# Patient Record
Sex: Female | Born: 1980 | Race: Black or African American | Hispanic: No | Marital: Married | Smoking: Never smoker
Health system: Southern US, Community
[De-identification: ages and names within clinical notes are randomized; demographics above are authoritative.]

---

## 2002-01-13 ENCOUNTER — Other Ambulatory Visit: Admission: RE | Admit: 2002-01-13 | Discharge: 2002-01-13 | Payer: Self-pay

## 2014-05-04 ENCOUNTER — Encounter: Payer: Self-pay | Admitting: Family Medicine

## 2014-05-04 ENCOUNTER — Ambulatory Visit (INDEPENDENT_AMBULATORY_CARE_PROVIDER_SITE_OTHER): Payer: BC Managed Care – PPO | Admitting: Family Medicine

## 2014-05-04 ENCOUNTER — Encounter (INDEPENDENT_AMBULATORY_CARE_PROVIDER_SITE_OTHER): Payer: Self-pay

## 2014-05-04 VITALS — BP 107/69 | HR 58 | Temp 98.4°F | Ht 64.5 in | Wt 157.0 lb

## 2014-05-04 DIAGNOSIS — R21 Rash and other nonspecific skin eruption: Secondary | ICD-10-CM

## 2014-05-04 MED ORDER — NYSTATIN-TRIAMCINOLONE 100000-0.1 UNIT/GM-% EX OINT: 1.0000 "application " | TOPICAL_OINTMENT | Freq: Two times a day (BID) | CUTANEOUS | Status: DC

## 2014-05-04 NOTE — Progress Notes (Signed)
   Subjective:    Patient ID: Natalie Ortiz, female    DOB: 1981-10-02, 33 y.o.   MRN: [id number]  HPI This 33 y.o. female presents for evaluation of rash on feet.  She gets this rash on her feet Every year around this time.   Review of Systems C/o rash on feet No chest pain, SOB, HA, dizziness, vision change, N/V, diarrhea, constipation, dysuria, urinary urgency or frequency, myalgias, arthralgias.     Objective:   Physical Exam Vital signs noted  Well developed well nourished female.   Extremities - No edema. Skin - macular papular rash on top of feet       Assessment & Plan:  Rash and nonspecific skin eruption - Plan: nystatin-triamcinolone ointment (MYCOLOG) Apply bid and follow up prn if not better.  Deatra CanterWilliam J Oxford FNP

## 2015-11-02 ENCOUNTER — Ambulatory Visit (INDEPENDENT_AMBULATORY_CARE_PROVIDER_SITE_OTHER): Payer: BLUE CROSS/BLUE SHIELD | Admitting: Family Medicine

## 2015-11-02 VITALS — BP 127/79 | HR 75 | Temp 99.3°F | Ht 64.5 in | Wt 176.0 lb

## 2015-11-02 DIAGNOSIS — M545 Low back pain: Secondary | ICD-10-CM

## 2015-11-02 DIAGNOSIS — T148XXA Other injury of unspecified body region, initial encounter: Secondary | ICD-10-CM

## 2015-11-02 MED ORDER — CYCLOBENZAPRINE HCL 10 MG PO TABS: 10.0000 mg | ORAL_TABLET | Freq: Three times a day (TID) | ORAL | Status: DC | PRN

## 2015-11-02 NOTE — Patient Instructions (Signed)
Take Flexeril and Advil or ibuprofen to help with inflammation and muscle tightness.  Use a frozen water bottle or a tennis ball and roll on the tender spot to be her back against a wall to help relieve pressure points.  Stretch her hamstrings by reaching towards her toes daily and also stretch on hands and knees while pulling her upper body to the side to help relieve back pressure.

## 2015-11-02 NOTE — Progress Notes (Signed)
BP 127/79 mmHg  Pulse 75  Temp(Src) 99.3 F (37.4 C) (Oral)  Ht 5' 4.5" (1.638 m)  Wt 176 lb (79.833 kg)  BMI 29.75 kg/m2  LMP 10/12/2015 (Approximate)   Subjective:    Patient ID: Natalie Ortiz, female    DOB: [birth year], 34 y.o.   MRN: [id number]  HPI: Natalie Ortiz is a 34 y.o. female presenting on 11/02/2015 for Back Pain   HPI Low back pain Patient is here today because she has been having bilateral low back pain worse on the right than left since October. 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 It was intermittent for the first while but over the last week and a half to 2 weeks she is feeling it more often and it is increased in severity. She denies any radicular symptoms going down her legs such as pain numbness or weakness. She has been using Tylenol and has been trying to rest and relax. This does not seem to be helping. 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 She denies any fevers or chills.  Relevant past medical, surgical, family and social history reviewed and updated as indicated. Interim medical history since our last visit reviewed. Allergies and medications reviewed and updated.  Review of Systems  Constitutional: Negative for fever and chills.  HENT: Negative for congestion, ear discharge and ear pain.   Eyes: Negative for redness and visual disturbance.  Respiratory: Negative for chest tightness and shortness of breath.   Cardiovascular: Negative for chest pain and leg swelling.  Genitourinary: Negative for dysuria and difficulty urinating.  Musculoskeletal: Positive for back pain. Negative for myalgias, arthralgias and gait problem.  Skin: Negative for rash.  Neurological: Negative for weakness, light-headedness, numbness and headaches.  Psychiatric/Behavioral: Negative for behavioral problems and agitation.  All other systems reviewed and are  negative.   Per HPI unless specifically indicated above     Medication List       This list is accurate as of: 11/02/15  5:31 PM.  Always use your most recent med list.               acetaminophen 500 MG tablet  Commonly known as:  TYLENOL  Take 500 mg by mouth every 6 (six) hours as needed.     cyclobenzaprine 10 MG tablet  Commonly known as:  FLEXERIL  Take 1 tablet (10 mg total) by mouth 3 (three) times daily as needed for muscle spasms.     SPRINTEC 28 0.25-35 MG-MCG tablet  Generic drug:  norgestimate-ethinyl estradiol  Take 1 tablet by mouth daily.           Objective:    BP 127/79 mmHg  Pulse 75  Temp(Src) 99.3 F (37.4 C) (Oral)  Ht 5' 4.5" (1.638 m)  Wt 176 lb (79.833 kg)  BMI 29.75 kg/m2  LMP 10/12/2015 (Approximate)  Wt Readings from Last 3 Encounters:  11/02/15 176 lb (79.833 kg)  05/04/14 157 lb (71.215 kg)    Physical Exam  Constitutional: She is oriented to person, place, and time. She appears well-developed and well-nourished. No distress.  Eyes: Conjunctivae and EOM are normal. Pupils are equal, round, and reactive to light.  Cardiovascular: Normal rate, regular rhythm, normal heart sounds and intact distal pulses.   No murmur heard. Pulmonary/Chest: Effort normal and breath sounds normal. No respiratory distress. She has no wheezes.  Musculoskeletal: Normal range of motion. She exhibits no edema.       Lumbar back: She exhibits tenderness (tenderness along right   Paraspinal muscles mostly, minimal on left side.) and spasm. She exhibits normal range of motion, no swelling, no edema, no deformity and no laceration.  Neurological: She is alert and oriented to person, place, and time. Coordination normal.  Skin: Skin is warm and dry. No rash noted. She is not diaphoretic.  Psychiatric: She has a normal mood and affect. Her behavior is normal.  Nursing note and vitals reviewed.   No results found for this or any previous visit.      Assessment & Plan:   Problem List Items Addressed This Visit    None    Visit Diagnoses    Muscle strain    -  Primary    Flexeril and hypertrophic and stretching. If not improved in a couple weeks come back and we'll do physical therapy.        Follow up plan: Return if symptoms worsen or fail to improve.  Counseling provided for all of the vaccine components No orders of the defined types were placed in this encounter.    Arville CareJoshua Jisella Ashenfelter, MD [facility] 11/02/2015, 5:31 PM

## 2016-03-17 DIAGNOSIS — H5213 Myopia, bilateral: Secondary | ICD-10-CM | POA: Diagnosis not present

## 2016-03-22 ENCOUNTER — Ambulatory Visit (INDEPENDENT_AMBULATORY_CARE_PROVIDER_SITE_OTHER): Payer: BLUE CROSS/BLUE SHIELD

## 2016-03-22 ENCOUNTER — Encounter: Payer: Self-pay | Admitting: *Deleted

## 2016-03-22 ENCOUNTER — Encounter: Payer: Self-pay | Admitting: Nurse Practitioner

## 2016-03-22 ENCOUNTER — Encounter (INDEPENDENT_AMBULATORY_CARE_PROVIDER_SITE_OTHER): Payer: Self-pay

## 2016-03-22 ENCOUNTER — Ambulatory Visit (INDEPENDENT_AMBULATORY_CARE_PROVIDER_SITE_OTHER): Payer: BLUE CROSS/BLUE SHIELD | Admitting: Nurse Practitioner

## 2016-03-22 VITALS — BP 129/84 | HR 83 | Temp 98.6°F | Ht 64.0 in | Wt 179.0 lb

## 2016-03-22 DIAGNOSIS — M25572 Pain in left ankle and joints of left foot: Secondary | ICD-10-CM

## 2016-03-22 MED ORDER — CAPSAICIN-MENTHOL-METHYL SAL 0.025-1-12 % EX CREA: 1.0000 "application " | TOPICAL_CREAM | Freq: Two times a day (BID) | CUTANEOUS | Status: DC

## 2016-03-22 MED ORDER — IBUPROFEN 600 MG PO TABS: 600.0000 mg | ORAL_TABLET | Freq: Three times a day (TID) | ORAL | Status: DC | PRN

## 2016-03-22 NOTE — Patient Instructions (Signed)

## 2016-03-22 NOTE — Progress Notes (Signed)
   Subjective:    Patient ID: Natalie Ortiz, female    DOB: 10-Jul-1981, 35 y.o.   MRN: [id number]  HPI Patient was last seen in 5 months for back pain. She presents today with a complaint of pain and swelling around her left ankle. Symptoms started about 2 weeks ago and is progressively getting worse. She rates that pain in the ankle about a 8/10 with activity. She has not tried using any medication or remedies. She reports occassionally shooting pain in the ankle when at rest. Denies any recent trauma, or falls. Denies fever or chills. She is able to bear weight on the ankle but it just hurts.   Review of Systems  Constitutional: Negative.  Negative for activity change and unexpected weight change.  Respiratory: Negative.  Negative for shortness of breath.   Cardiovascular: Negative.  Negative for chest pain.  Musculoskeletal: Positive for joint swelling (left ankle swelling). Negative for back pain.  Skin: Negative.  Negative for color change.  Psychiatric/Behavioral: Negative.        Objective:   Physical Exam  Constitutional: She is oriented to person, place, and time. She appears well-developed and well-nourished.  Cardiovascular: Normal rate and regular rhythm.   Pulmonary/Chest: Effort normal and breath sounds normal.  Musculoskeletal: Normal range of motion. She exhibits edema (swelling on left anterior anke. no readness noted).       Feet:  Neurological: She is alert and oriented to person, place, and time.  Psychiatric: She has a normal mood and affect. Her behavior is normal. Thought content normal.      BP 129/84 mmHg  Pulse 83  Temp(Src) 98.6 F (37 C) (Oral)  Ht 5\' 4"  (1.626 m)  Wt 179 lb (81.194 kg)  BMI 30.71 kg/m2    X-ray of left ankle:  Assessment & Plan:  1. Left ankle pain Elevate legs when sitting Use ankle brace RTO PRN - ibuprofen (ADVIL,MOTRIN) 600 MG tablet; Take 1 tablet (600 mg total) by mouth every 8 (eight) hours as needed.  Dispense: 30  tablet; Refill: 0 - Capsaicin-Menthol-Methyl Sal (CAPSAICIN-METHYL SAL-MENTHOL) 0.025-1-12 % CREA; Apply 1 application topically 2 (two) times daily.  Dispense: 56.6 g; Refill: 3 - DG Ankle Complete Left; Future  Mary-Margaret Daphine DeutscherMartin, FNP

## 2016-03-27 ENCOUNTER — Encounter (INDEPENDENT_AMBULATORY_CARE_PROVIDER_SITE_OTHER): Payer: BLUE CROSS/BLUE SHIELD | Admitting: Family Medicine

## 2016-03-27 ENCOUNTER — Emergency Department (HOSPITAL_COMMUNITY): Payer: Worker's Compensation

## 2016-03-27 ENCOUNTER — Encounter: Payer: Self-pay | Admitting: Family Medicine

## 2016-03-27 ENCOUNTER — Emergency Department (HOSPITAL_COMMUNITY)
Admission: EM | Admit: 2016-03-27 | Discharge: 2016-03-27 | Disposition: A | Discharge status: Home / Self Care | Payer: Worker's Compensation | Attending: Emergency Medicine | Admitting: Emergency Medicine

## 2016-03-27 ENCOUNTER — Ambulatory Visit (INDEPENDENT_AMBULATORY_CARE_PROVIDER_SITE_OTHER): Payer: BLUE CROSS/BLUE SHIELD

## 2016-03-27 ENCOUNTER — Encounter (HOSPITAL_COMMUNITY): Payer: Self-pay | Admitting: Emergency Medicine

## 2016-03-27 VITALS — BP 131/91 | HR 72 | Temp 97.0°F | Ht 64.0 in | Wt 179.0 lb

## 2016-03-27 DIAGNOSIS — Y999 Unspecified external cause status: Secondary | ICD-10-CM | POA: Insufficient documentation

## 2016-03-27 DIAGNOSIS — W1849XA Other slipping, tripping and stumbling without falling, initial encounter: Secondary | ICD-10-CM

## 2016-03-27 DIAGNOSIS — Z79899 Other long term (current) drug therapy: Secondary | ICD-10-CM | POA: Diagnosis not present

## 2016-03-27 DIAGNOSIS — Z791 Long term (current) use of non-steroidal anti-inflammatories (NSAID): Secondary | ICD-10-CM | POA: Diagnosis not present

## 2016-03-27 DIAGNOSIS — S43005A Unspecified dislocation of left shoulder joint, initial encounter: Secondary | ICD-10-CM

## 2016-03-27 DIAGNOSIS — S43015A Anterior dislocation of left humerus, initial encounter: Secondary | ICD-10-CM | POA: Diagnosis not present

## 2016-03-27 DIAGNOSIS — W010XXA Fall on same level from slipping, tripping and stumbling without subsequent striking against object, initial encounter: Secondary | ICD-10-CM

## 2016-03-27 DIAGNOSIS — Y929 Unspecified place or not applicable: Secondary | ICD-10-CM | POA: Diagnosis not present

## 2016-03-27 DIAGNOSIS — M25512 Pain in left shoulder: Secondary | ICD-10-CM | POA: Diagnosis not present

## 2016-03-27 DIAGNOSIS — Y939 Activity, unspecified: Secondary | ICD-10-CM | POA: Diagnosis not present

## 2016-03-27 DIAGNOSIS — IMO0001 Reserved for inherently not codable concepts without codable children: Secondary | ICD-10-CM

## 2016-03-27 MED ORDER — FENTANYL CITRATE (PF) 100 MCG/2ML IJ SOLN
50.0000 ug | INTRAMUSCULAR | Status: DC | PRN
  Administered 2016-03-27: 50 ug via INTRAVENOUS
  Filled 2016-03-27: qty 2

## 2016-03-27 MED ORDER — PROPOFOL 10 MG/ML IV BOLUS
INTRAVENOUS | Status: AC | PRN
  Administered 2016-03-27: 40 ug via INTRAVENOUS

## 2016-03-27 MED ORDER — PROPOFOL 10 MG/ML IV BOLUS
INTRAVENOUS | Status: AC | PRN
  Administered 2016-03-27: 20 ug via INTRAVENOUS

## 2016-03-27 MED ORDER — HYDROCODONE-ACETAMINOPHEN 5-325 MG PO TABS: 1.0000 | ORAL_TABLET | Freq: Four times a day (QID) | ORAL | Status: DC | PRN

## 2016-03-27 MED ORDER — PROPOFOL 10 MG/ML IV BOLUS
INTRAVENOUS | Status: AC
  Filled 2016-03-27: qty 20

## 2016-03-27 NOTE — ED Notes (Signed)
PT states she slipped on something and fell onto left shoulder and [facility] and was told to come to ED for left shoulder dislocation.

## 2016-03-27 NOTE — ED Provider Notes (Signed)
CSN: [id number]     Arrival date & time 03/27/16  1105 History   First MD Initiated Contact with Patient 03/27/16 1203     Chief Complaint  Patient presents with  . Dislocation     (Consider location/radiation/quality/duration/timing/severity/associated sxs/prior Treatment) HPI   Patient presents with left shoulder dislocation diagnosed at outside facility. Patient states she slipped and fell onto left outstretched arm this morning around 9:30 AM. Denies any head or neck injury. Family practice office and had initial x-ray which demonstrated a left shoulder dislocation. Advised to come to the emergency department for reduction. She states she last ate at 6:30 this morning. She's had no previous surgeries. No known allergies. Denies any weakness or numbness to the left arm.    History reviewed. No pertinent past medical history. History reviewed. No pertinent past surgical history. History reviewed. No pertinent family history. Social History  Substance Use Topics  . Smoking status: Never Smoker   . Smokeless tobacco: None  . Alcohol Use: Yes     Comment: rare   OB History    No data available     Review of Systems  Musculoskeletal: Positive for arthralgias. Negative for back pain and neck pain.  Skin: Negative for rash and wound.  Neurological: Negative for syncope, weakness, numbness and headaches.  All other systems reviewed and are negative.     Allergies  Review of patient's allergies indicates no known allergies.  Home Medications   Prior to Admission medications   Medication Sig Start Date End Date Taking? Authorizing Provider  ibuprofen (ADVIL,MOTRIN) 600 MG tablet Take 1 tablet (600 mg total) by mouth every 8 (eight) hours as needed. 03/22/16  Yes Mary-Margaret Daphine Deutscher, FNP  norgestimate-ethinyl estradiol (SPRINTEC 28) 0.25-35 MG-MCG tablet Take 1 tablet by mouth daily.   Yes Historical Provider, MD  HYDROcodone-acetaminophen (NORCO) 5-325 MG tablet Take 1 tablet  by mouth every 6 (six) hours as needed for severe pain. 03/27/16   Loren Racer, MD   BP 128/80 mmHg  Pulse 78  Temp(Src) 98.2 F (36.8 C) (Oral)  Resp 18  SpO2 100%  LMP 02/28/2016 Physical Exam  Constitutional: She is oriented to person, place, and time. She appears well-developed and well-nourished. No distress.  Mildly drowsy  HENT:  Head: Normocephalic and atraumatic.  Mouth/Throat: Oropharynx is clear and moist.  Mallampati class 3  Eyes: EOM are normal. Pupils are equal, round, and reactive to light.  Neck: Normal range of motion. Neck supple.  No posterior midline cervical tenderness to palpation. Patient does have muscle spasm to the left trapezius muscle.   Cardiovascular: Normal rate and regular rhythm.   Pulmonary/Chest: Effort normal and breath sounds normal. No respiratory distress. She has no wheezes. She has no rales.  Abdominal: Soft. Bowel sounds are normal.  Musculoskeletal: She exhibits no edema or tenderness.  Decrease range of motion of the left shoulder. Shoulder is held in abduction and internal rotation. Step-off noted. 2+ distal radial pulses. Full range of motion of the left elbow and wrist.  Neurological: She is alert and oriented to person, place, and time.  Skin: Skin is warm and dry. No rash noted. No erythema.  Psychiatric: She has a normal mood and affect. Her behavior is normal.  Nursing note and vitals reviewed.   ED Course  Reduction of dislocation Date/Time: 03/27/2016 12:20 PM Performed by: Loren Racer Authorized by: Ranae Palms, Unique Searfoss Consent: Written consent obtained. Consent given by: spouse Patient identity confirmed: verbally with patient and arm band Time out:  Immediately prior to procedure a "time out" was called to verify the correct patient, procedure, equipment, support staff and site/side marked as required. Local anesthesia used: no Patient sedated: yes Sedatives: propofol Sedation start date/time: 03/27/2016 12:20  PM Sedation end date/time: 03/27/2016 12:35 PM Vitals: Vital signs were monitored during sedation. Patient tolerance: Patient tolerated the procedure well with no immediate complications Comments: Placed in shoulder immobilizer   (including critical care time) Labs Review Labs Reviewed - No data to display  Imaging Review Dg Shoulder Left  03/27/2016  CLINICAL DATA:  35 year old female who fell at work in dislocated her left shoulder. Post reduction. Initial encounter. EXAM: LEFT SHOULDER - 2+ VIEW COMPARISON:  [facility] left shoulder series 1032 hours today. FINDINGS: Reduced left anterior glenohumeral joint dislocation depicted earlier today. No proximal left humerus or left scapula fracture identified. Left clavicle, the visible left ribs and lung parenchyma appear within normal limits. IMPRESSION: Reduced left glenohumeral joint dislocation. No acute fracture identified. Electronically Signed   By: Odessa FlemingH  Hall M.D.   On: 03/27/2016 13:45   Dg Shoulder Left  03/27/2016  CLINICAL DATA:  Fall.  Pain. EXAM: LEFT SHOULDER - 2+ VIEW COMPARISON:  No prior. FINDINGS: Subcoracoid left shoulder dislocation is noted. No evidence of fracture. No evidence of separation. IMPRESSION: Subcoracoid left shoulder dislocation. Electronically Signed   By: Maisie Fushomas  Register   On: 03/27/2016 11:36   I have personally reviewed and evaluated these images and lab results as part of my medical decision-making.   EKG Interpretation None      MDM   Final diagnoses:  Dislocation  Shoulder dislocation, left, initial encounter   Successful reduction of left shoulder dislocation. Patient now awake and alert. We'll discharge home to follow-up with orthopedics. Return precautions given.     Loren Raceravid Lyden Redner, MD 03/27/16 1401

## 2016-03-27 NOTE — Progress Notes (Signed)
This encounter was created in error - please disregard.  Pt went to ER

## 2016-03-27 NOTE — Discharge Instructions (Signed)
Shoulder Dislocation °A shoulder dislocation happens when the upper arm bone (humerus) moves out of the shoulder joint. The shoulder joint is the part of the shoulder where the humerus, shoulder blade (scapula), and collarbone (clavicle) meet. °CAUSES °This condition is often caused by: °· A fall. °· A hit to the shoulder. °· A forceful movement of the shoulder. °RISK FACTORS °This condition is more likely to develop in people who play sports. °SYMPTOMS °Symptoms of this condition include: °· Deformity of the shoulder. °· Intense pain. °· Inability to move the shoulder. °· Numbness, weakness, or tingling in your neck or down your arm. °· Bruising or swelling around your shoulder. °DIAGNOSIS °This condition is diagnosed with a physical exam. After the exam, tests may be done to check for related problems. Tests that may be done include: °· X-ray. This may be done to check for broken bones. °· MRI. This may be done to check for damage to the tissues around the shoulder. °· Electromyogram. This may be done to check for nerve damage. °TREATMENT °This condition is treated with a procedure to place the humerus back in the joint. This procedure is called a reduction. There are two types of reduction: °· Closed reduction. In this procedure, the humerus is placed back in the joint without surgery. The health care provider uses his or her hands to guide the bone back into place. °· Open reduction. In this procedure, the humerus is placed back in the joint with surgery. An open reduction may be recommended if: °¨ You have a weak shoulder joint or weak ligaments. °¨ You have had more than one shoulder dislocation. °¨ The nerves or blood vessels around your shoulder have been damaged. °After the humerus is placed back into the joint, your arm will be placed in a splint or sling to prevent it from moving. You will need to wear the splint or sling until your shoulder heals. When the splint or sling is removed, you may have  physical therapy to help improve the range of motion in your shoulder joint. °HOME CARE INSTRUCTIONS °If You Have a Splint or Sling: °· Wear it as told by your health care provider. Remove it only as told by your health care provider. °· Loosen it if your fingers become numb and tingle, or if they turn cold and blue. °· Keep it clean and dry. °Bathing °· Do not take baths, swim, or use a hot tub until your health care provider approves. Ask your health care provider if you can take showers. You may only be allowed to take sponge baths for bathing. °· If your health care provider approves bathing and showering, cover your splint or sling with a watertight plastic bag to protect it from water. Do not let the splint or sling get wet. °Managing Pain, Stiffness, and Swelling °· If directed, apply ice to the injured area. °¨ Put ice in a plastic bag. °¨ Place a towel between your skin and the bag. °¨ Leave the ice on for 20 minutes, 2-3 times per day. °· Move your fingers often to avoid stiffness and to decrease swelling. °· Raise (elevate) the injured area above the level of your heart while you are sitting or lying down. °Driving °· Do not drive while wearing a splint or sling on a hand that you use for driving. °· Do not drive or operate heavy machinery while taking pain medicine. °Activity °· Return to your normal activities as told by your health care provider. Ask your   health care provider what activities are safe for you. °· Perform range-of-motion exercises only as told by your health care provider. °· Exercise your hand by squeezing a soft ball. This helps to decrease stiffness and swelling in your hand and wrist. °General Instructions °· Take over-the-counter and prescription medicines only as told by your health care provider. °· Do not use any tobacco products, including cigarettes, chewing tobacco, or e-cigarettes. Tobacco can delay bone and tissue healing. If you need help quitting, ask your health care  provider. °· Keep all follow-up visits as told by your health care provider. This is important. °SEEK MEDICAL CARE IF: °· Your splint or sling gets damaged. °SEEK IMMEDIATE MEDICAL CARE IF: °· Your pain gets worse rather than better. °· You lose feeling in your arm or hand. °· Your arm or hand becomes white and cold. °  °This information is not intended to replace advice given to you by your health care provider. Make sure you discuss any questions you have with your health care provider. °  °Document Released: 08/28/2001 Document Revised: 08/24/2015 Document Reviewed: 03/28/2015 °Elsevier Interactive Patient Education ©2016 [employer] ° °

## 2016-03-27 NOTE — Patient Instructions (Signed)
Pt went to ER

## 2016-04-02 ENCOUNTER — Telehealth: Payer: Self-pay | Admitting: Orthopaedic Surgery

## 2016-04-02 NOTE — Telephone Encounter (Signed)
Spoke with patient - states she had called last week [facility] for work-related injury of 03/17/16, shoulder dislocation.  She was made aware to contact employer, [employer], for protocol, as we are unable to schedule directly with patient if Worker's comp, due to processes.   Patient states no word back.  I've therefore contacted employer, [employer] at ph# [phone]; spoke w/supervisor Nita Sellsena McNeil (alternate# [phone]) and faxed our Worker's comp employer portion form to [phone].  Called patient back to notify of status.  Appointment pending approval by [employer].

## 2016-04-03 ENCOUNTER — Ambulatory Visit: Payer: BLUE CROSS/BLUE SHIELD | Admitting: Orthopaedic Surgery

## 2016-04-03 NOTE — Telephone Encounter (Signed)
Patient called to relay that her employer [employer], is willing to have her seen for this injury and pay directly, Vs. Submitting to Workers comp.  I relayed to patient that this not our protocol, however, I will re-check with employer, and will forward message to our Engineer, manufacturingpractice manager. Per Nita Sellsena McNeil at [employer], states patient and their office administrator were discussing, and confirmed that our Workers comp paperwork is there and ready to be filled out. Appointment still pending - patient aware.

## 2016-04-05 NOTE — Telephone Encounter (Signed)
04/04/16 Followed up with employer and with patient, and was told that employer is agreeing to file with Workers comp, and that forms will be faxed back to our office as soon as possible.  Appointment pending.

## 2016-04-18 NOTE — Telephone Encounter (Signed)
Spoke with patient; agreed to schedule patient as guarantor Workers comp, billing patient's employer [employer] directly, per Despina Hickeborah [employer], owner Vs submitting to their insurer (spoke with 04/17/16).  Patient aware of the appointment tomorrow with Dr. Hilda LiasKeeling.

## 2016-04-19 ENCOUNTER — Ambulatory Visit: Payer: Self-pay | Admitting: Orthopaedic Surgery

## 2016-04-24 ENCOUNTER — Ambulatory Visit (INDEPENDENT_AMBULATORY_CARE_PROVIDER_SITE_OTHER): Payer: Worker's Compensation | Admitting: Orthopaedic Surgery

## 2016-04-24 ENCOUNTER — Telehealth: Payer: Self-pay | Admitting: Orthopaedic Surgery

## 2016-04-24 ENCOUNTER — Encounter: Payer: Self-pay | Admitting: Orthopaedic Surgery

## 2016-04-24 ENCOUNTER — Ambulatory Visit (INDEPENDENT_AMBULATORY_CARE_PROVIDER_SITE_OTHER): Payer: Worker's Compensation

## 2016-04-24 VITALS — BP 118/86 | HR 69 | Temp 97.3°F | Resp 16 | Ht 64.0 in | Wt 180.0 lb

## 2016-04-24 DIAGNOSIS — M25512 Pain in left shoulder: Secondary | ICD-10-CM

## 2016-04-24 NOTE — Patient Instructions (Signed)
Dr[facility] Hilda [facility]Keeling [facility] but workers comp to determine location [facility] note for 2 weeks

## 2016-04-24 NOTE — Telephone Encounter (Signed)
Regarding patient's visit today, 04/24/16, approved by employer, [employer], ph# [phone], office contact Talbert ForestShirley, provided owner/administrator Gavin PoundDeborah [employer] ph# [phone]. Attempted to leave voice message; messages full. I've also contacted Nita Sellsena McNeil, patient's supervisor, who also advises to speak with Despina Hickeborah [employer].  Also attempted to reach by fax (# [phone]), as per initially done at time we began communicating with employer per previous note. At this time, follow up care including physical therapy awaiting approval from employer's worker's comp insurer.  Records indicate that this information has not been provided, as employer requested to bill them directly.  Patient aware that this information and insurer approval are needed.

## 2016-04-24 NOTE — Progress Notes (Signed)
Subjective: I dislocated my left shoulder on March 27, 2016 at work    Patient ID: Natalie Ortiz, female    DOB: 1981-06-03, 35 y.o.   MRN: [id number]  Shoulder Injury  The incident occurred at work. The left shoulder is affected. The incident occurred more than 1 week ago. The injury mechanism was a fall. The quality of the pain is described as aching. The pain does not radiate. The pain is at a severity of 2/10. The pain is mild. Pertinent negatives include no chest pain, muscle weakness, numbness or tingling. The symptoms are aggravated by movement. She has tried immobilization for the symptoms. The treatment provided moderate relief.   She fell at work at [employer] in [place] on 03-27-16 around 9:35 AM.  She reported the injury to [employer] (supervisor) and Lattie Haw Clinical research associate).  The patient was getting hand sanitizer and her foot got caught on a curtain causing her to fall on the left shoulder.  She had immediate pain and inability to move the left arm and shoulder.  She was taken to [facility].  X-rays showed an anterior dislocation of the left shoulder.  She was then taken to [facility] ER where she had reduction of the dislocation.  She has been in a sling since the injury.  This is her first medical visit since the ER. There was delay because of Worker's Compensation she says.  She has no new trauma. She has no numbness.  She has no other injury.     Review of Systems  HENT: Negative for congestion.   Respiratory: Negative for cough and shortness of breath.   Cardiovascular: Negative for chest pain and leg swelling.  Endocrine: Negative for cold intolerance.  Musculoskeletal: Positive for arthralgias.  Allergic/Immunologic: Negative for environmental allergies.  Neurological: Negative for tingling and numbness.  All other systems reviewed and are negative.  History reviewed. No pertinent past medical history.  History reviewed. No  pertinent past surgical history.  Current Outpatient Prescriptions on File Prior to Visit  Medication Sig Dispense Refill  . HYDROcodone-acetaminophen (NORCO) 5-325 MG tablet Take 1 tablet by mouth every 6 (six) hours as needed for severe pain. 10 tablet 0  . ibuprofen (ADVIL,MOTRIN) 600 MG tablet Take 1 tablet (600 mg total) by mouth every 8 (eight) hours as needed. 30 tablet 0  . norgestimate-ethinyl estradiol (SPRINTEC 28) 0.25-35 MG-MCG tablet Take 1 tablet by mouth daily.     No current facility-administered medications on file prior to visit.    Social History   Social History  . Marital Status: Married    Spouse Name: N/A  . Number of Children: N/A  . Years of Education: N/A   Occupational History  . Not on file.   Social History Main Topics  . Smoking status: Never Smoker   . Smokeless tobacco: Not on file  . Alcohol Use: Yes     Comment: rare  . Drug Use: No  . Sexual Activity: Not on file   Other Topics Concern  . Not on file   Social History Narrative    BP 118/86 mmHg  Pulse 69  Temp(Src) 97.3 F (36.3 C)  Resp 16  Ht  (1.626 m)  Wt 180 lb (81.647 kg)  BMI 30.88 kg/m2  LMP 02/28/2016     Objective:   Physical Exam  Constitutional: She is oriented to person, place, and time. She appears well-developed and well-nourished.  HENT:  Head: Normocephalic and  atraumatic.  Eyes: Conjunctivae and EOM are normal. Pupils are equal, round, and reactive to light.  Neck: Normal range of motion. Neck supple.  Cardiovascular: Normal rate, regular rhythm and intact distal pulses.   Pulmonary/Chest: Effort normal.  Abdominal: Soft.  Musculoskeletal: She exhibits tenderness (She is tender over the left shoulder.  I did not put it through a ROM today.  NV is intact.  Grips are normal. Right shoudler normal.).  Neurological: She is alert and oriented to person, place, and time. She displays normal reflexes. No cranial nerve deficit. She exhibits normal muscle  tone. Coordination normal.  Skin: Skin is warm and dry.  Psychiatric: She has a normal mood and affect. Her behavior is normal. Judgment and thought content normal.   X-rays were done of the left shoulder, reported separately.  I have reviewed the records and x-rays and the reports from both [place] and the ER.    Assessment & Plan:   Encounter Diagnosis  Name Primary?  . Left shoulder pain Yes  Post dislocation of the left shoulder  I have ordered OT for the left shoulder and arm.  Precautions given to the patient.  I have shown her circumduction exercises to do.    I will see her in two weeks.  Stay out of work.  Come out of the sling.  Call if any problem.

## 2016-05-07 ENCOUNTER — Ambulatory Visit: Payer: BLUE CROSS/BLUE SHIELD | Attending: Orthopaedic Surgery | Admitting: Physical Therapy

## 2016-05-07 DIAGNOSIS — M25512 Pain in left shoulder: Secondary | ICD-10-CM

## 2016-05-07 DIAGNOSIS — M25612 Stiffness of left shoulder, not elsewhere classified: Secondary | ICD-10-CM | POA: Diagnosis not present

## 2016-05-07 NOTE — Therapy (Signed)
[facility] [address] [place], [place], [address] Phone: [phone]   Fax:  [phone]  Physical Therapy Evaluation  Patient Details  Name: Natalie Ortiz MRN: [id number] Date of Birth: 23-Feb-1981 Referring Provider: Darreld Mclean MD  Encounter Date: 05/07/2016      PT End of Session - 05/07/16 1413    Visit Number 1   Number of Visits 12   Date for PT Re-Evaluation 06/18/16   PT Start Time 1126   PT Stop Time 1211   PT Time Calculation (min) 45 min   Activity Tolerance Patient tolerated treatment well   Behavior During Therapy [facility] for tasks assessed/performed      No past medical history on file.  No past surgical history on file.  There were no vitals filed for this visit.       Subjective Assessment - 05/07/16 1414    Subjective My shoulder is beginning to feel better but still hurts with movement.   Patient Stated Goals To be able to use my left arm without pain.   Currently in Pain? Yes   Pain Score 2    Pain Location Shoulder   Pain Orientation Left   Pain Descriptors / Indicators Shooting   Pain Type Acute pain   Pain Onset More than a month ago   Pain Frequency Intermittent   Aggravating Factors  Moving my left arm.   Pain Relieving Factors Being still.            [facility] PT Assessment - 05/07/16 0001    Assessment   Medical Diagnosis Left shoulder pain.   Referring Provider Darreld Mclean MD   Onset Date/Surgical Date --  03/27/16.   Precautions   Precautions None   Restrictions   Weight Bearing Restrictions No   Balance Screen   Has the patient fallen in the past 6 months Yes   How many times? --  1   Has the patient had a decrease in activity level because of a fear of falling?  No   Is the patient reluctant to leave their home because of a fear of falling?  No   Home [employer]ist information centre manager residence   Prior Function   Level of Independence Independent   ROM / Strength    AROM / PROM / Strength AROM   AROM   Overall AROM Comments In supine the patient achieved left shoulder flexion to 125 degrees; ER= 50 degrees and IR to 30 degrees.   Palpation   Palpation comment Patient had some pain at the left acromial ridge but her CC is referred pain into the middle deltoif region with endrange movement of her left shoulder.                   [facility] Adult PT Treatment/Exercise - 05/07/16 0001    Modalities   Modalities Electrical Stimulation   Electrical Stimulation   Electrical Stimulation Location Left shoulder.   Electrical Stimulation Action IFC   Electrical Stimulation Parameters 80-150 Hz   Electrical Stimulation Goals Pain                     PT Long Term Goals - 05/07/16 1424    PT LONG TERM GOAL #1   Title Ind with a HEP.   Time 6   Period Weeks   Status New   PT LONG TERM GOAL #2   Title Active left shoulder flexion to 155 degrees so the patient  can easily reach overhead   Time 6   Period Weeks   Status New   PT LONG TERM GOAL #3   Title Active ER to 80 degrees+ to allow for easily donning/doffing of apparel   Time 6   Period Weeks   Status New   PT LONG TERM GOAL #4   Title Increase ROM so patient is able to reach behind back to L3.   Time 6   Period Weeks   Status New   PT LONG TERM GOAL #5   Title Increase left shoulder strength to a solid 4+/5 to increase stability for performance of functional activities   Time 6   Period Weeks   Status New   Additional Long Term Goals   Additional Long Term Goals Yes   PT LONG TERM GOAL #6   Title Perform ADL's with left shoulder pain not > 3/10.               Plan - 05/07/16 1416    Clinical Impression Statement The patient fell while at work on 03/27/16.  She got her foot caught and fell onto her left shoulder.  She was in excuriating pain and eventually presented to an ED.  An X-ray revealed her left shoulder was dislocated.  Her shoulder was relocated in the  ED.  She states that her doctor is allowing her to do pendulums exercises holding a coffee cup.  Her resting pain-level is a 2/10 and up to 4-5/10 with movement.   Rehab Potential Excellent   PT Frequency 2x / week   PT Duration 6 weeks   PT Treatment/Interventions ADLs/Self Care Home Management;Cryotherapy;Electrical Stimulation;Moist Heat;Therapeutic exercise;Therapeutic activities;Ultrasound;Neuromuscular re-education;Patient/family education;Manual techniques;Passive range of motion      Patient will benefit from skilled therapeutic intervention in order to improve the following deficits and impairments:  Pain, Decreased activity tolerance, Decreased range of motion  Visit Diagnosis: Pain in left shoulder - Plan: PT plan of care cert/re-cert  Stiffness of left shoulder, not elsewhere classified - Plan: PT plan of care cert/re-cert     Problem List There are no active problems to display for this patient.   APPLEGATE, [place]HAD MPT 05/07/2016, 4:01 PM  [facility] [address] [place], [place], [address] Phone: [phone]   Fax:  [phone]  Name: Natalie Ortiz MRN: [id number] Date of Birth: [birth year]

## 2016-05-08 ENCOUNTER — Encounter: Payer: Self-pay | Admitting: Orthopaedic Surgery

## 2016-05-08 ENCOUNTER — Ambulatory Visit (INDEPENDENT_AMBULATORY_CARE_PROVIDER_SITE_OTHER): Payer: Worker's Compensation | Admitting: Orthopaedic Surgery

## 2016-05-08 VITALS — BP 127/70 | HR 78 | Temp 97.5°F | Ht 64.0 in | Wt 177.0 lb

## 2016-05-08 DIAGNOSIS — M25512 Pain in left shoulder: Secondary | ICD-10-CM | POA: Diagnosis not present

## 2016-05-08 NOTE — Patient Instructions (Signed)
Out of work 

## 2016-05-08 NOTE — Progress Notes (Signed)
Patient Natalie Ortiz, female DOB:1981-02-09, 35 y.o. UJW:[id number]  Chief Complaint  Patient presents with  . Follow-up    2 week follow up left shoulder, Workers Comp    HPI  Natalie Ortiz is a 35 y.o. female who had history of dislocation of the shoulder on the left on 03-27-16.  She has been going to OT recently and is making good progress.  She has less pain, more motion.  She has more appointments to fulfill.  She has no numbness, no new trauma.  HPI  Body mass index is 30.37 kg/(m^2).  ROS  Review of Systems  HENT: Negative for congestion.   Respiratory: Negative for cough and shortness of breath.   Cardiovascular: Negative for chest pain and leg swelling.  Endocrine: Negative for cold intolerance.  Musculoskeletal: Positive for arthralgias.  Allergic/Immunologic: Negative for environmental allergies.  Neurological: Negative for numbness.  All other systems reviewed and are negative.   History reviewed. No pertinent past medical history.  History reviewed. No pertinent past surgical history.  History reviewed. No pertinent family history.  Social History Social History  Substance Use Topics  . Smoking status: Never Smoker   . Smokeless tobacco: None  . Alcohol Use: Yes     Comment: rare    No Known Allergies  Current Outpatient Prescriptions  Medication Sig Dispense Refill  . HYDROcodone-acetaminophen (NORCO) 5-325 MG tablet Take 1 tablet by mouth every 6 (six) hours as needed for severe pain. 10 tablet 0  . ibuprofen (ADVIL,MOTRIN) 600 MG tablet Take 1 tablet (600 mg total) by mouth every 8 (eight) hours as needed. 30 tablet 0  . norgestimate-ethinyl estradiol (SPRINTEC 28) 0.25-35 MG-MCG tablet Take 1 tablet by mouth daily.     No current facility-administered medications for this visit.     Physical Exam  Blood pressure 127/70, pulse 78, temperature 97.5 F (36.4 C), height  (1.626 m), weight 177 lb (80.287 kg).  Constitutional: overall  normal hygiene, normal nutrition, well developed, normal grooming, normal body habitus. Assistive device:none  Musculoskeletal: gait and station Limp none, muscle tone and strength are normal, no tremors or atrophy is present.  .  Neurological: coordination overall normal.  Deep tendon reflex/nerve stretch intact.  Sensation normal.  Cranial nerves II-XII intact.   Skin:   normal overall no scars, lesions, ulcers or rashes. No psoriasis.  Psychiatric: Alert and oriented x 3.  Recent memory intact, remote memory unclear.  Normal mood and affect. Well groomed.  Good eye contact.  Cardiovascular: overall no swelling, no varicosities, no edema bilaterally, normal temperatures of the legs and arms, no clubbing, cyanosis and good capillary refill.  Lymphatic: palpation is normal. Examination of left Upper Extremity is done.  Inspection:   Overall:  Elbow non-tender without crepitus or defects, forearm non-tender without crepitus or defects, wrist non-tender without crepitus or defects, hand non-tender.    Shoulder: with glenohumeral joint tenderness, without effusion.   Upper arm: without swelling and tenderness   Range of motion:   Overall:  Full range of motion of the elbow, full range of motion of wrist and full range of motion in fingers.   Shoulder:  left  160 degrees forward flexion; 150 degrees abduction; 35 degrees internal rotation, 35 degrees external rotation, 20 degrees extension, 40 degrees adduction.   Stability:   Overall:  Shoulder, elbow and wrist stable   Strength and Tone:   Overall full shoulder muscles strength, full upper arm strength and normal upper arm bulk and tone.  The patient has been educated about the nature of the problem(s) and counseled on treatment options.  The patient appeared to understand what I have discussed and is in agreement with it.  Encounter Diagnosis  Name Primary?  . Left shoulder pain Yes    PLAN Call if any problems.  Precautions  discussed.  Continue current medications.   Return to clinic 2 weeks  Continue OT.

## 2016-05-10 ENCOUNTER — Ambulatory Visit: Payer: BLUE CROSS/BLUE SHIELD | Admitting: Physical Therapy

## 2016-05-10 DIAGNOSIS — M25512 Pain in left shoulder: Secondary | ICD-10-CM

## 2016-05-10 DIAGNOSIS — M25612 Stiffness of left shoulder, not elsewhere classified: Secondary | ICD-10-CM | POA: Diagnosis not present

## 2016-05-10 NOTE — Therapy (Signed)
[facility] [address] [place], [place], [address] Phone: [phone]   Fax:  [phone]  Physical Therapy Treatment  Patient Details  Name: Shara Hartis MRN: [id number] Date of Birth: Jan 08, 1981 Referring Provider: Darreld Mclean MD  Encounter Date: 05/10/2016      PT End of Session - 05/10/16 1306    Visit Number 2   Number of Visits 12   Date for PT Re-Evaluation 06/18/16   PT Start Time 1304   PT Stop Time 1357   PT Time Calculation (min) 53 min   Activity Tolerance Patient tolerated treatment well   Behavior During Therapy [facility] for tasks assessed/performed      No past medical history on file.  No past surgical history on file.  There were no vitals filed for this visit.      Subjective Assessment - 05/10/16 1306    Subjective My shoulder is beginning to feel better but still hurts with movement. The most I  move it is with bathing.   Patient Stated Goals To be able to use my left arm without pain.   Currently in Pain? No/denies                         [facility] Adult PT Treatment/Exercise - 05/10/16 0001    Exercises   Exercises Shoulder   Shoulder Exercises: Supine   Protraction Strengthening;Left;5 reps   Internal Rotation Strengthening;Left;15 reps   Other Supine Exercises isometrics all planes with shoulder at 90 deg flexion   Shoulder Exercises: Prone   Extension Strengthening;Left;15 reps   Horizontal ABduction 1 Strengthening;Left;15 reps   Horizontal ABduction 2 Strengthening;Left;15 reps   Other Prone Exercises Row x 15    Shoulder Exercises: Sidelying   External Rotation Strengthening;Left;20 reps;Weights  first 10 with no weight   External Rotation Weight (lbs) 2   Flexion Strengthening;Left;15 reps   Other Sidelying Exercises empty can x 20   Modalities   Modalities Electrical Stimulation   Electrical Stimulation   Electrical Stimulation Location Left shoulder.   Electrical  Stimulation Action IFC   Electrical Stimulation Parameters 80-150 Hz to tolerance x 15 min   Electrical Stimulation Goals Pain                PT Education - 05/10/16 1508    Education provided Yes   Education Details HEP   Person(s) Educated Patient   Methods Explanation;Demonstration;Handout   Comprehension Verbalized understanding;Returned demonstration             PT Long Term Goals - 05/07/16 1424    PT LONG TERM GOAL #1   Title Ind with a HEP.   Time 6   Period Weeks   Status New   PT LONG TERM GOAL #2   Title Active left shoulder flexion to 155 degrees so the patient can easily reach overhead   Time 6   Period Weeks   Status New   PT LONG TERM GOAL #3   Title Active ER to 80 degrees+ to allow for easily donning/doffing of apparel   Time 6   Period Weeks   Status New   PT LONG TERM GOAL #4   Title Increase ROM so patient is able to reach behind back to L3.   Time 6   Period Weeks   Status New   PT LONG TERM GOAL #5   Title Increase left shoulder strength to a solid 4+/5 to increase stability for performance of  functional activities   Time 6   Period Weeks   Status New   Additional Long Term Goals   Additional Long Term Goals Yes   PT LONG TERM GOAL #6   Title Perform ADL's with left shoulder pain not > 3/10.               Plan - 05/10/16 1508    Clinical Impression Statement Patient did very well with TE today. She reported some pain in anterior L shoulder with TE, but it resolved with estim.    PT Next Visit Plan continue with strengthening as tolerated. Modalities for pain.   PT Home Exercise Plan prone T, Y, row and shoulder extension    Consulted and Agree with Plan of Care Patient      Patient will benefit from skilled therapeutic intervention in order to improve the following deficits and impairments:  Pain, Decreased activity tolerance, Decreased range of motion  Visit Diagnosis: Pain in left shoulder  Stiffness of left  shoulder, not elsewhere classified     Problem List There are no active problems to display for this patient.   Solon PalmJulie Curstin Schmale PT  05/10/2016, 3:11 PM  [facility] [address] [place], [place], [address] Phone: [phone]   Fax:  [phone]  Name: Hillery JacksJennifer Sedlack MRN: [id number] Date of Birth: [birth year]

## 2016-05-16 ENCOUNTER — Encounter: Payer: Self-pay | Admitting: Physical Therapy

## 2016-05-16 ENCOUNTER — Ambulatory Visit: Payer: BLUE CROSS/BLUE SHIELD | Admitting: Physical Therapy

## 2016-05-16 DIAGNOSIS — M25612 Stiffness of left shoulder, not elsewhere classified: Secondary | ICD-10-CM

## 2016-05-16 DIAGNOSIS — M25512 Pain in left shoulder: Secondary | ICD-10-CM | POA: Diagnosis not present

## 2016-05-16 NOTE — Therapy (Signed)
[facility] [place], [place], [address] Phone: [phone]   Fax:  [phone]  Physical Therapy Treatment  Patient Details  Name: Jia Dottavio MRN: [id number] Date of Birth: 25-Jan-1981 Referring Provider: Sanjuana Kava MD  Encounter Date: 05/16/2016      PT End of Session - 05/16/16 1301    Visit Number 3   Number of Visits 12   Date for PT Re-Evaluation 06/18/16   PT Start Time 1234   PT Stop Time 1314   PT Time Calculation (min) 40 min   Activity Tolerance Patient tolerated treatment well   Behavior During Therapy [facility] for tasks assessed/performed      History reviewed. No pertinent past medical history.  History reviewed. No pertinent past surgical history.  There were no vitals filed for this visit.      Subjective Assessment - 05/16/16 1236    Subjective Patient reported feeling better after last treatment   Patient Stated Goals To be able to use my left arm without pain.   Currently in Pain? No/denies            [facility] PT Assessment - 05/16/16 0001    AROM   Overall AROM  Deficits;Within functional limits for tasks performed   Overall AROM Comments Left shoulder flexion 165 degrees                     [facility] Adult PT Treatment/Exercise - 05/16/16 0001    Shoulder Exercises: Supine   Other Supine Exercises cane for AAROM flexion x10   Shoulder Exercises: Prone   Extension Strengthening;Left;AROM  3x10   Horizontal ABduction 1 Strengthening;Left;15 reps   Horizontal ABduction 2 Strengthening;Left;15 reps   Other Prone Exercises rows 3x10   Shoulder Exercises: Sidelying   External Rotation Strengthening;Left;AROM  3x10   Shoulder Exercises: Standing   Other Standing Exercises 4 way shoulder isometrics 5sec x10 each way   Electrical Stimulation   Electrical Stimulation Location Left shoulder.   Electrical Stimulation Action IFC   Electrical Stimulation Parameters 80-150hz    Electrical Stimulation Goals Pain                     PT Long Term Goals - 05/16/16 1301    PT LONG TERM GOAL #1   Title Ind with a HEP.   Time 6   Period Weeks   Status On-going   PT LONG TERM GOAL #2   Title Active left shoulder flexion to 155 degrees so the patient can easily reach overhead   Time 6   Period Weeks   Status Achieved  AROM 165 degrees 05/16/06   PT LONG TERM GOAL #3   Title Active ER to 80 degrees+ to allow for easily donning/doffing of apparel   Time 6   Period Weeks   Status On-going   PT LONG TERM GOAL #4   Title Increase ROM so patient is able to reach behind back to L3.   Time 6   Period Weeks   Status On-going   PT LONG TERM GOAL #5   Title Increase left shoulder strength to a solid 4+/5 to increase stability for performance of functional activities   Time 6   Period Weeks   Status On-going   PT LONG TERM GOAL #6   Title Perform ADL's with left shoulder pain not > 3/10.   Time 6   Period Weeks   Status On-going  Plan - 05/16/16 1302    Clinical Impression Statement Patient progressing with improvement overall. patient has reported feeling 85% better overall and had no pain pre or post treatment, only 1/10 soreness with exercises which was relieved after elecrical stimulation today. Improved AROM for left shoulder flexion. Patient met LTG #2 today other goals ongoing due to pain, ROM and strength deficits.   Rehab Potential Excellent   PT Frequency 2x / week   PT Duration 6 weeks   PT Treatment/Interventions ADLs/Self Care Home Management;Cryotherapy;Electrical Stimulation;Moist Heat;Therapeutic exercise;Therapeutic activities;Ultrasound;Neuromuscular re-education;Patient/family education;Manual techniques;Passive range of motion   PT Next Visit Plan continue with strengthening as tolerated. Modalities for pain. (MD. Luna Glasgow 05/22/16)   Consulted and Agree with Plan of Care Patient      Patient will benefit from  skilled therapeutic intervention in order to improve the following deficits and impairments:  Pain, Decreased activity tolerance, Decreased range of motion  Visit Diagnosis: Pain in left shoulder  Stiffness of left shoulder, not elsewhere classified     Problem List There are no active problems to display for this patient.   Nohealani Medinger, [place] P, PTA 05/16/2016, 1:16 PM  [facility] [address] [place], [place], [address] Phone: [phone]   Fax:  [phone]  Name: Kayin Kettering MRN: [id number] Date of Birth: [birth year]

## 2016-05-21 ENCOUNTER — Encounter: Payer: Self-pay | Admitting: Physical Therapy

## 2016-05-21 ENCOUNTER — Ambulatory Visit: Payer: BLUE CROSS/BLUE SHIELD | Attending: Orthopaedic Surgery | Admitting: Physical Therapy

## 2016-05-21 DIAGNOSIS — M25512 Pain in left shoulder: Secondary | ICD-10-CM | POA: Diagnosis not present

## 2016-05-21 DIAGNOSIS — M25612 Stiffness of left shoulder, not elsewhere classified: Secondary | ICD-10-CM | POA: Insufficient documentation

## 2016-05-21 NOTE — Therapy (Signed)
[facility] [address] [place], [place], [address] Phone: [phone]   Fax:  [phone]  Physical Therapy Treatment  Patient Details  Name: Natalie Ortiz MRN: [id number] Date of Birth: 22-Jan-1981 Referring Provider: Darreld McleanWayne Keeling MD  Encounter Date: 05/21/2016      PT End of Session - 05/21/16 1249    Visit Number 4   Number of Visits 12   Date for PT Re-Evaluation 06/18/16   PT Start Time 1235   PT Stop Time 1316   PT Time Calculation (min) 41 min   Activity Tolerance Patient tolerated treatment well   Behavior During Therapy [facility] for tasks assessed/performed      History reviewed. No pertinent past medical history.  History reviewed. No pertinent past surgical history.  There were no vitals filed for this visit.      Subjective Assessment - 05/21/16 1237    Subjective Patient reported some soreness from possibly sleeping on it, no pain at rest up to 1/10 with certain movements   Patient Stated Goals To be able to use my left arm without pain.   Currently in Pain? No/denies            [facility] Assessment - 05/21/16 0001    AROM   Overall AROM  Deficits   Overall AROM Comments AROM 62 degrees ER                     [facility] Adult PT Treatment/Exercise - 05/21/16 0001    Shoulder Exercises: Supine   Other Supine Exercises cane for chest press 2x10   Shoulder Exercises: Prone   Extension Strengthening;Left;AROM  3x10   Horizontal ABduction 1 Strengthening;Left;15 reps   Horizontal ABduction 2 Strengthening;Left;15 reps   Other Prone Exercises rows 3x10   Shoulder Exercises: Sidelying   External Rotation Strengthening;Left;AROM  3x10   Shoulder Exercises: Standing   Other Standing Exercises 4 way shoulder isometrics 5sec x5 each way   Electrical Stimulation   Electrical Stimulation Location Left shoulder.   Electrical Stimulation Action IFC   Electrical Stimulation Parameters 80-150hz    Electrical Stimulation Goals Pain                     PT Long Term Goals - 05/21/16 1250    PT LONG TERM GOAL #1   Title Ind with a HEP.   Time 6   Period Weeks   Status On-going   PT LONG TERM GOAL #2   Title Active left shoulder flexion to 155 degrees so the patient can easily reach overhead   Time 6   Period Weeks   Status Achieved   PT LONG TERM GOAL #3   Title Active ER to 80 degrees+ to allow for easily donning/doffing of apparel   Time 6   Period Weeks   Status On-going  AROM 62 degrees 05/21/16   PT LONG TERM GOAL #4   Title Increase ROM so patient is able to reach behind back to L3.   Time 6   Period Weeks   Status On-going   PT LONG TERM GOAL #5   Title Increase left shoulder strength to a solid 4+/5 to increase stability for performance of functional activities   Time 6   Period Weeks   Status On-going               Plan - 05/21/16 1300    Clinical Impression Statement Patient progressing slowly yet is improving overall with no  pain at rest and slight increse with activity. Patient tolerated treatment well today. Patient improving with AROM yet unable to meet any further goals due to full ROM and strength deficits. Patient would like to return to work on light duty, MPT told patient it was ok pending MD approval.   Rehab Potential Excellent   PT Frequency 2x / week   PT Duration 6 weeks   PT Treatment/Interventions ADLs/Self Care Home Management;Cryotherapy;Electrical Stimulation;Moist Heat;Therapeutic exercise;Therapeutic activities;Ultrasound;Neuromuscular re-education;Patient/family education;Manual techniques;Passive range of motion   PT Next Visit Plan continue with strengthening as tolerated. Modalities for pain. (MD. Hilda Lias 05/22/16)   Consulted and Agree with Plan of Care Patient      Patient will benefit from skilled therapeutic intervention in order to improve the following deficits and impairments:  Pain, Decreased activity  tolerance, Decreased range of motion  Visit Diagnosis: Pain in left shoulder  Stiffness of left shoulder, not elsewhere classified     Problem List There are no active problems to display for this patient.   APPLEGATE, [place], PTA 05/21/2016, 5:38 PM  [place] MPT [place], [place] 05/21/2016 5:38 PM   [facility] [address] [place], [place], [address] Phone: [phone]   Fax:  [phone]  Name: Natalie Ortiz MRN: [id number] Date of Birth: November 04, 1981

## 2016-05-22 ENCOUNTER — Ambulatory Visit (INDEPENDENT_AMBULATORY_CARE_PROVIDER_SITE_OTHER): Payer: Worker's Compensation | Admitting: Orthopaedic Surgery

## 2016-05-22 ENCOUNTER — Encounter: Payer: Self-pay | Admitting: Orthopaedic Surgery

## 2016-05-22 VITALS — BP 112/72 | HR 76 | Temp 97.5°F | Ht 61.75 in | Wt 180.2 lb

## 2016-05-22 DIAGNOSIS — M25512 Pain in left shoulder: Secondary | ICD-10-CM | POA: Diagnosis not present

## 2016-05-22 NOTE — Progress Notes (Signed)
Patient Natalie Ortiz, female DOB:11/09/1981, 35 y.o. AOZ:[id number]  Chief Complaint  Patient presents with  . Follow-up    Left shoulder ,in therapy    HPI  Natalie Ortiz is a 35 y.o. female who has left shoulder pain.  She has been to OT and is significantly improved.  She has less pain, no paresthesias, near normal motion.  She is sleeping better.  She is doing her exercises at home.  She is very pleased.  She has more OT scheduled.  I will have her return to work limited duty now.  HPI  Body mass index is 33.25 kg/(m^2).  ROS  Review of Systems  HENT: Negative for congestion.   Respiratory: Negative for cough and shortness of breath.   Cardiovascular: Negative for chest pain and leg swelling.  Endocrine: Negative for cold intolerance.  Musculoskeletal: Positive for arthralgias.  Allergic/Immunologic: Negative for environmental allergies.  Neurological: Negative for numbness.  All other systems reviewed and are negative.   History reviewed. No pertinent past medical history.  History reviewed. No pertinent past surgical history.  History reviewed. No pertinent family history.  Social History Social History  Substance Use Topics  . Smoking status: Never Smoker   . Smokeless tobacco: None  . Alcohol Use: Yes     Comment: rare    No Known Allergies  Current Outpatient Prescriptions  Medication Sig Dispense Refill  . norgestimate-ethinyl estradiol (SPRINTEC 28) 0.25-35 MG-MCG tablet Take 1 tablet by mouth daily.     No current facility-administered medications for this visit.     Physical Exam  Blood pressure 112/72, pulse 76, temperature 97.5 F (36.4 C), height 5' 1.75" (1.568 m), weight 180 lb 3.2 oz (81.738 kg).  Constitutional: overall normal hygiene, normal nutrition, well developed, normal grooming, normal body habitus. Assistive device:none  Musculoskeletal: gait and station Limp none, muscle tone and strength are normal, no tremors or atrophy  is present.  .  Neurological: coordination overall normal.  Deep tendon reflex/nerve stretch intact.  Sensation normal.  Cranial nerves II-XII intact.   Skin:   normal overall no scars, lesions, ulcers or rashes. No psoriasis.  Psychiatric: Alert and oriented x 3.  Recent memory intact, remote memory unclear.  Normal mood and affect. Well groomed.  Good eye contact.  Cardiovascular: overall no swelling, no varicosities, no edema bilaterally, normal temperatures of the legs and arms, no clubbing, cyanosis and good capillary refill.  Lymphatic: palpation is normal.  Examination of left Upper Extremity is done.  Inspection:   Overall:  Elbow non-tender without crepitus or defects, forearm non-tender without crepitus or defects, wrist non-tender without crepitus or defects, hand non-tender.    Shoulder: with glenohumeral joint tenderness, without effusion.   Upper arm: without swelling and tenderness   Range of motion:   Overall:  Full range of motion of the elbow, full range of motion of wrist and full range of motion in fingers.   Shoulder:  left  180 degrees forward flexion; 180 degrees abduction; 40 degrees internal rotation, 40 degrees external rotation, 25 degrees extension, 40 degrees adduction.   Stability:   Overall:  Shoulder, elbow and wrist stable   Strength and Tone:   Overall full shoulder muscles strength, full upper arm strength and normal upper arm bulk and tone.   The patient has been educated about the nature of the problem(s) and counseled on treatment options.  The patient appeared to understand what I have discussed and is in agreement with it.  Encounter Diagnosis  Name Primary?  . Left shoulder pain Yes    PLAN Call if any problems.  Precautions discussed.  Continue current medications.   Return to clinic 3 weeks   Continue OT.  Electronically Signed Darreld McleanWayne Deandrew Hoecker, MD 6/6/201711:30 AM

## 2016-05-22 NOTE — Patient Instructions (Signed)
Return to work light dudy. No heavy lifting and no over the head lifting.

## 2016-05-23 ENCOUNTER — Ambulatory Visit: Payer: BLUE CROSS/BLUE SHIELD | Admitting: Physical Therapy

## 2016-05-23 ENCOUNTER — Encounter: Payer: Self-pay | Admitting: Physical Therapy

## 2016-05-23 DIAGNOSIS — M25512 Pain in left shoulder: Secondary | ICD-10-CM | POA: Diagnosis not present

## 2016-05-23 DIAGNOSIS — M25612 Stiffness of left shoulder, not elsewhere classified: Secondary | ICD-10-CM

## 2016-05-23 NOTE — Therapy (Signed)
[facility] [address] [place], [place], [address] Phone: [phone]   Fax:  [phone]  Physical Therapy Treatment  Patient Details  Name: Natalie Ortiz MRN: [id number] Date of Birth: 1981/08/17 Referring Provider: Darreld Mclean MD  Encounter Date: 05/23/2016      PT End of Session - 05/23/16 1304    Visit Number 5   Number of Visits 12   Date for PT Re-Evaluation 06/18/16   PT Start Time 1231   PT Stop Time 1326   PT Time Calculation (min) 55 min   Activity Tolerance Patient tolerated treatment well   Behavior During Therapy [facility] for tasks assessed/performed      History reviewed. No pertinent past medical history.  History reviewed. No pertinent past surgical history.  There were no vitals filed for this visit.      Subjective Assessment - 05/23/16 1249    Subjective Patient went to MD and is to continue and return 3 weeks   Patient Stated Goals To be able to use my left arm without pain.   Currently in Pain? Yes   Pain Score 2    Pain Location Shoulder   Pain Orientation Left   Pain Descriptors / Indicators Shooting   Pain Type Acute pain   Pain Onset More than a month ago   Pain Frequency Intermittent   Aggravating Factors  use of arm   Pain Relieving Factors at rest                         [facility] Adult PT Treatment/Exercise - 05/23/16 0001    Shoulder Exercises: Supine   Other Supine Exercises cane for chest press 2x10   Shoulder Exercises: Seated   Retraction Strengthening;AROM;Both  3x10   Shoulder Exercises: Prone   Extension Strengthening;Left;AROM  3x10   Horizontal ABduction 1 Strengthening;Left;15 reps   Horizontal ABduction 2 Strengthening;Left;15 reps   Other Prone Exercises rows 3x10   Shoulder Exercises: Sidelying   External Rotation Strengthening;Left;AROM  3x10   Shoulder Exercises: Standing   Protraction Strengthening;Left;Theraband  2x10   Theraband Level (Shoulder  Protraction) Level 1 (Yellow)   External Rotation Strengthening;Left;Theraband  2x10   Theraband Level (Shoulder External Rotation) Level 1 (Yellow)   Internal Rotation Strengthening;Left;Theraband  2x10   Theraband Level (Shoulder Internal Rotation) Level 1 (Yellow)   Row Strengthening;Left;Theraband  2x10   Theraband Level (Shoulder Row) Level 1 (Yellow)   Electrical Stimulation   Electrical Stimulation Location Left shoulder.   Electrical Stimulation Action IFC   Electrical Stimulation Parameters 80-150hz    Electrical Stimulation Goals Pain                     PT Long Term Goals - 05/21/16 1250    PT LONG TERM GOAL #1   Title Ind with a HEP.   Time 6   Period Weeks   Status On-going   PT LONG TERM GOAL #2   Title Active left shoulder flexion to 155 degrees so the patient can easily reach overhead   Time 6   Period Weeks   Status Achieved   PT LONG TERM GOAL #3   Title Active ER to 80 degrees+ to allow for easily donning/doffing of apparel   Time 6   Period Weeks   Status On-going  AROM 62 degrees 05/21/16   PT LONG TERM GOAL #4   Title Increase ROM so patient is able to reach behind back to L3.  Time 6   Period Weeks   Status On-going   PT LONG TERM GOAL #5   Title Increase left shoulder strength to a solid 4+/5 to increase stability for performance of functional activities   Time 6   Period Weeks   Status On-going               Plan - 05/23/16 1312    Clinical Impression Statement Patient progressing with all activities and is able to return to work per MD. Patient restrictions per MD are no overhead or heavy lifting. Patient progressed today with low strengthening and stabilization for shoulder. Patient tolerated treatment with no pain complaitns. Patient goals ongoing due to ROM and strength deficits.   Rehab Potential Excellent   Clinical Impairments Affecting Rehab Potential dislocation on 03/27/16 / current 8 weeks 05/23/16   PT Frequency  2x / week   PT Duration 6 weeks   PT Treatment/Interventions ADLs/Self Care Home Management;Cryotherapy;Electrical Stimulation;Moist Heat;Therapeutic exercise;Therapeutic activities;Ultrasound;Neuromuscular re-education;Patient/family education;Manual techniques;Passive range of motion   PT Next Visit Plan continue with strengthening as tolerated. Modalities for pain. No overhead or heavy lifting (MD. Hilda LiasKeeling 06/02/16)   Consulted and Agree with Plan of Care Patient      Patient will benefit from skilled therapeutic intervention in order to improve the following deficits and impairments:  Pain, Decreased activity tolerance, Decreased range of motion  Visit Diagnosis: Pain in left shoulder  Stiffness of left shoulder, not elsewhere classified     Problem List There are no active problems to display for this patient.   Hermelinda DellenDUNFORD, Armanie Ullmer P, PTA 05/23/2016, 1:34 PM  [facility] [address] [place], [place], [address] Phone: [phone]   Fax:  [phone]  Name: Natalie JacksJennifer Ortiz MRN: [id number] Date of Birth: [birth year]

## 2016-05-29 ENCOUNTER — Ambulatory Visit: Payer: BLUE CROSS/BLUE SHIELD | Admitting: Physical Therapy

## 2016-05-30 ENCOUNTER — Encounter: Payer: Self-pay | Admitting: Physical Therapy

## 2016-05-30 ENCOUNTER — Ambulatory Visit: Payer: BLUE CROSS/BLUE SHIELD | Admitting: Physical Therapy

## 2016-05-30 DIAGNOSIS — M25612 Stiffness of left shoulder, not elsewhere classified: Secondary | ICD-10-CM

## 2016-05-30 DIAGNOSIS — M25512 Pain in left shoulder: Secondary | ICD-10-CM

## 2016-05-30 NOTE — Therapy (Signed)
[facility] [address] [place], [place], [address] Phone: [phone]   Fax:  [phone]  Physical Therapy Treatment  Patient Details  Name: Natalie JacksJennifer Brunton MRN: [id number] Date of Birth: [birth year] Referring Provider: Darreld McleanWayne Keeling MD  Encounter Date: 05/30/2016      PT End of Session - 05/30/16 1344    Visit Number 6   Number of Visits 12   Date for PT Re-Evaluation 06/18/16   PT Start Time 1346   PT Stop Time 1434   PT Time Calculation (min) 48 min   Activity Tolerance Patient tolerated treatment well   Behavior During Therapy [facility] for tasks assessed/performed      No past medical history on file.  No past surgical history on file.  There were no vitals filed for this visit.      Subjective Assessment - 05/30/16 1344    Subjective Reports that Saturday she may have slept wrong on shoulder as it was achey all day. Reports that she has some pain today with certain movements. Reports going back to work "light duty" although she is doing the same work but if she has trouble with a clinic she doesn't have to do those things.   Patient Stated Goals To be able to use my left arm without pain.   Currently in Pain? No/denies            [facility] PT Assessment - 05/30/16 0001    Assessment   Medical Diagnosis Left shoulder pain.   Onset Date/Surgical Date 03/27/16   Precautions   Precautions None   Restrictions   Weight Bearing Restrictions No   ROM / Strength   AROM / PROM / Strength AROM   AROM   Overall AROM  Deficits   AROM Assessment Site Shoulder   Right/Left Shoulder Left   Left Shoulder External Rotation 55 Degrees                     [facility] Adult PT Treatment/Exercise - 05/30/16 0001    Shoulder Exercises: Seated   Other Seated Exercises B scapular squeeze 3x10 reps   Shoulder Exercises: Prone   Retraction AROM;Left;20 reps   Extension AROM;Left;20 reps   Horizontal ABduction 1 AROM;Left;20  reps  palm down   Horizontal ABduction 2 AROM;Left;20 reps  Thumb down   Shoulder Exercises: Sidelying   External Rotation AROM;Left;20 reps   Flexion AROM;Left;20 reps   Shoulder Exercises: Standing   Protraction Strengthening;Left;20 reps;Theraband   Theraband Level (Shoulder Protraction) Level 1 (Yellow)   External Rotation Strengthening;Left;20 reps;Theraband   Theraband Level (Shoulder External Rotation) Level 1 (Yellow)   Internal Rotation Strengthening;Left;20 reps;Theraband   Theraband Level (Shoulder Internal Rotation) Level 1 (Yellow)   Extension Strengthening;Left;20 reps;Theraband   Theraband Level (Shoulder Extension) Level 1 (Yellow)   Row Strengthening;Left;20 reps;Theraband   Theraband Level (Shoulder Row) Level 1 (Yellow)   Modalities   Modalities Electrical Stimulation   Electrical Stimulation   Electrical Stimulation Location L shoulder   Electrical Stimulation Action IFC   Electrical Stimulation Parameters 1-10 Hz x15 min   Electrical Stimulation Goals Pain                     PT Long Term Goals - 05/30/16 1344    PT LONG TERM GOAL #1   Title Ind with a HEP.   Time 6   Period Weeks   Status Achieved   PT LONG TERM GOAL #2   Title Active  left shoulder flexion to 155 degrees so the patient can easily reach overhead   Time 6   Period Weeks   Status Achieved   PT LONG TERM GOAL #3   Title Active ER to 80 degrees+ to allow for easily donning/doffing of apparel   Time 6   Period Weeks   Status On-going  AROM 62 degrees 05/21/16   PT LONG TERM GOAL #4   Title Increase ROM so patient is able to reach behind back to L3.   Time 6   Period Weeks   Status On-going   PT LONG TERM GOAL #5   Title Increase left shoulder strength to a solid 4+/5 to increase stability for performance of functional activities   Time 6   Period Weeks   Status On-going   PT LONG TERM GOAL #6   Title Perform ADL's with left shoulder pain not > 3/10.   Time 6    Period Weeks   Status On-going               Plan - 05/30/16 1422    Clinical Impression Statement Patient tolerated today's treatment well with reports of minimal pain with exercise. UBE was initated at 120 RPM backwards to which patient noted she experienced soreness during UBE. Two audible occurances of popping were heard by PTA during treatment to which patient notes that that happens "numerous times" with activity. Patient was educated regarding L shoulder popping that if the popping continues or is painful to talk with MD about that in their next treatment. Patient experienced minimal pain with resisted L shoulder row, ext, ER, and IR although none with protraction with yellow theraband. Firm end feels noted during PROM of L shoulder into flexion and ER with smooth arc of motion. Normal modalities response noted following removal of the modalities. Patient denied pain following today's treatment.   Rehab Potential Excellent   Clinical Impairments Affecting Rehab Potential dislocation on 03/27/16 / current 9 weeks 05/29/2016   PT Frequency 2x / week   PT Duration 6 weeks   PT Treatment/Interventions ADLs/Self Care Home Management;Cryotherapy;Electrical Stimulation;Moist Heat;Therapeutic exercise;Therapeutic activities;Ultrasound;Neuromuscular re-education;Patient/family education;Manual techniques;Passive range of motion   PT Next Visit Plan continue with strengthening as tolerated. Modalities for pain. No overhead or heavy lifting (MD. Hilda Lias 06/02/16)   PT Home Exercise Plan prone T, Y, row and shoulder extension    Consulted and Agree with Plan of Care Patient      Patient will benefit from skilled therapeutic intervention in order to improve the following deficits and impairments:  Pain, Decreased activity tolerance, Decreased range of motion  Visit Diagnosis: Pain in left shoulder  Stiffness of left shoulder, not elsewhere classified     Problem List There are no active  problems to display for this patient.   Evelene Croon, PTA 05/30/2016, 2:47 PM  [facility] [address] [place], [place], [address] Phone: [phone]   Fax:  [phone]  Name: Elease Swarm MRN: [id number] Date of Birth: [birth year]

## 2016-05-31 ENCOUNTER — Ambulatory Visit: Payer: BLUE CROSS/BLUE SHIELD | Admitting: Physical Therapy

## 2016-05-31 ENCOUNTER — Encounter: Payer: Self-pay | Admitting: Physical Therapy

## 2016-05-31 DIAGNOSIS — M25512 Pain in left shoulder: Secondary | ICD-10-CM | POA: Diagnosis not present

## 2016-05-31 DIAGNOSIS — M25612 Stiffness of left shoulder, not elsewhere classified: Secondary | ICD-10-CM | POA: Diagnosis not present

## 2016-05-31 NOTE — Therapy (Signed)
[facility] [address] [place], [place], [address] Phone: [phone]   Fax:  [phone]  Physical Therapy Treatment  Patient Details  Name: Natalie Ortiz MRN: [id number] Date of Birth: 10/07/1981 Referring Provider: Darreld McleanWayne Keeling MD  Encounter Date: 05/31/2016      PT End of Session - 05/31/16 1551    Visit Number 7   Number of Visits 12   Date for PT Re-Evaluation 06/18/16   PT Start Time 1553   PT Stop Time 1636   PT Time Calculation (min) 43 min      No past medical history on file.  No past surgical history on file.  There were no vitals filed for this visit.      Subjective Assessment - 05/31/16 1551    Subjective Reports that she is sore from yesterday's treatment and thinks that she may have a little swelling in anterior humerus region.   Patient Stated Goals To be able to use my left arm without pain.   Currently in Pain? Yes   Pain Score 4    Pain Location Shoulder   Pain Orientation Left   Pain Descriptors / Indicators Sore   Pain Type Acute pain   Pain Onset More than a month ago   Pain Frequency Intermittent            [facility] PT Assessment - 05/31/16 0001    Assessment   Medical Diagnosis Left shoulder pain.   Onset Date/Surgical Date 03/27/16   Next MD Visit 06/12/2016   Precautions   Precautions None   Restrictions   Weight Bearing Restrictions No                     [facility] Adult PT Treatment/Exercise - 05/31/16 0001    Shoulder Exercises: Seated   External Rotation Strengthening;Both;20 reps;Theraband   Theraband Level (Shoulder External Rotation) Level 1 (Yellow)   Shoulder Exercises: Prone   Retraction AROM;Left;20 reps   Extension AROM;Left;20 reps   Horizontal ABduction 1 AROM;Left;20 reps  palm down   Horizontal ABduction 2 AROM;Left;20 reps  thumb down   Shoulder Exercises: Sidelying   External Rotation AROM;Left;20 reps   Flexion AROM;Left;20 reps   Shoulder  Exercises: Standing   Protraction Strengthening;Left;20 reps;Theraband   Theraband Level (Shoulder Protraction) Level 1 (Yellow)   External Rotation Strengthening;Left;20 reps;Theraband   Theraband Level (Shoulder External Rotation) Level 1 (Yellow)   Internal Rotation Strengthening;Left;20 reps;Theraband   Theraband Level (Shoulder Internal Rotation) Level 1 (Yellow)   Extension Strengthening;Left;Theraband  3x10 reps   Theraband Level (Shoulder Extension) Level 1 (Yellow)   Row Strengthening;Left;20 reps;Theraband   Theraband Level (Shoulder Row) Level 1 (Yellow)   Modalities   Modalities Cryotherapy;Electrical Stimulation   Cryotherapy   Number Minutes Cryotherapy 15 Minutes   Cryotherapy Location Shoulder   Type of Cryotherapy Ice pack   Electrical Stimulation   Electrical Stimulation Location L shoulder   Electrical Stimulation Action IFC   Electrical Stimulation Parameters 1-10 hz x15 min   Electrical Stimulation Goals Pain   Manual Therapy   Manual Therapy Passive ROM   Passive ROM PROM of L shoulder into flex/ER with gentle holds at end range                     PT Long Term Goals - 05/30/16 1344    PT LONG TERM GOAL #1   Title Ind with a HEP.   Time 6   Period Weeks  Status Achieved   PT LONG TERM GOAL #2   Title Active left shoulder flexion to 155 degrees so the patient can easily reach overhead   Time 6   Period Weeks   Status Achieved   PT LONG TERM GOAL #3   Title Active ER to 80 degrees+ to allow for easily donning/doffing of apparel   Time 6   Period Weeks   Status On-going  AROM 62 degrees 05/21/16   PT LONG TERM GOAL #4   Title Increase ROM so patient is able to reach behind back to L3.   Time 6   Period Weeks   Status On-going   PT LONG TERM GOAL #5   Title Increase left shoulder strength to a solid 4+/5 to increase stability for performance of functional activities   Time 6   Period Weeks   Status On-going   PT LONG TERM GOAL #6    Title Perform ADL's with left shoulder pain not > 3/10.   Time 6   Period Weeks   Status On-going               Plan - 05/31/16 1624    Clinical Impression Statement Patient tolerated today's treatment well although she arrived at clinic with increased L anterior shoulder soreness. UBE was not conducted today as to avoid increasing already present soreness. Completed all exercises well with only minimal multimodal cueing required. Patient continues to report L shoulder popping and can be audibly heard during AROM exercises but patient reported that popping is not painful. Patient intermittantly verbalized soreness in L shoulder during today's therapeutic exercises. Firm end feels noted with PROM of L shoulder into flexion and ER with smooth arc of motion noted. Normal modalities response noted following removal of the modalities with ice pack added to decrease inflammation. Patient's L anterior humerus region was visualized with having minimal inflammation in mid to upper Biceps region today. Patient denied L shoulder soreness upon end of treatment.   Rehab Potential Excellent   Clinical Impairments Affecting Rehab Potential dislocation on 03/27/16 / current 9 weeks 05/29/2016   PT Frequency 2x / week   PT Duration 6 weeks   PT Treatment/Interventions ADLs/Self Care Home Management;Cryotherapy;Electrical Stimulation;Moist Heat;Therapeutic exercise;Therapeutic activities;Ultrasound;Neuromuscular re-education;Patient/family education;Manual techniques;Passive range of motion   PT Next Visit Plan continue with strengthening as tolerated. Modalities for pain. No overhead or heavy lifting (MD. Hilda Lias 06/02/16)   PT Home Exercise Plan prone T, Y, row and shoulder extension    Consulted and Agree with Plan of Care Patient      Patient will benefit from skilled therapeutic intervention in order to improve the following deficits and impairments:  Pain, Decreased activity tolerance, Decreased range  of motion  Visit Diagnosis: Pain in left shoulder  Stiffness of left shoulder, not elsewhere classified     Problem List There are no active problems to display for this patient.   Evelene Croon, PTA 05/31/2016, 4:39 PM  [facility] [address] [place], [place], [address] Phone: [phone]   Fax:  [phone]  Name: Natalie Ortiz MRN: [id number] Date of Birth: Oct 29, 1981

## 2016-06-04 ENCOUNTER — Ambulatory Visit: Payer: BLUE CROSS/BLUE SHIELD | Admitting: *Deleted

## 2016-06-04 DIAGNOSIS — M25612 Stiffness of left shoulder, not elsewhere classified: Secondary | ICD-10-CM | POA: Diagnosis not present

## 2016-06-04 DIAGNOSIS — M25512 Pain in left shoulder: Secondary | ICD-10-CM

## 2016-06-04 NOTE — Therapy (Signed)
[facility] [address] [place], [place], [address] Phone: [phone]   Fax:  [phone]  Physical Therapy Treatment  Patient Details  Name: Natalie Ortiz MRN: [id number] Date of Birth: 06-26-1981 Referring Provider: Darreld McleanWayne Keeling MD  Encounter Date: 06/04/2016      PT End of Session - 06/04/16 1642    Visit Number 8   Number of Visits 12   Date for PT Re-Evaluation 06/18/16   PT Start Time 1430   PT Stop Time 1530   PT Time Calculation (min) 60 min   Activity Tolerance Patient tolerated treatment well   Behavior During Therapy [facility] for tasks assessed/performed      No past medical history on file.  No past surgical history on file.  There were no vitals filed for this visit.      Subjective Assessment - 06/04/16 1434    Subjective Doing better today. No pain in shldr   Patient Stated Goals To be able to use my left arm without pain.   Currently in Pain? No/denies                         [facility] Treatment/Exercise - 06/04/16 0001    Shoulder Exercises: Prone   Retraction AROM;Left;20 reps   Extension AROM;Left;20 reps   Horizontal ABduction 1 AROM;Left;20 reps  palm down   Horizontal ABduction 2 AROM;Left;20 reps  thumb down   Shoulder Exercises: Standing   Protraction Strengthening;Left;20 reps;Theraband   Theraband Level (Shoulder Protraction) Level 1 (Yellow)   External Rotation Strengthening;Left;20 reps;Theraband   Theraband Level (Shoulder External Rotation) Level 1 (Yellow)   Internal Rotation Strengthening;Left;20 reps;Theraband   Theraband Level (Shoulder Internal Rotation) Level 1 (Yellow)   Extension Strengthening;Left;Theraband  3x10 reps   Theraband Level (Shoulder Extension) Level 1 (Yellow)   Row Strengthening;Left;20 reps;Theraband   Theraband Level (Shoulder Row) Level 1 (Yellow)   Shoulder Exercises: ROM/Strengthening   UBE (Upper Arm Bike) 6 mins at 120 RPMs   Modalities   Modalities Cryotherapy;Electrical Stimulation   Cryotherapy   Number Minutes Cryotherapy 15 Minutes   Cryotherapy Location Shoulder   Type of Cryotherapy Ice pack   Electrical Stimulation   Electrical Stimulation Location L shoulder   Electrical Stimulation Action IFC   Electrical Stimulation Parameters 1-10hz    Electrical Stimulation Goals Pain   Manual Therapy   Manual Therapy Passive ROM   Passive ROM PROM of L shoulder into flex/ER with gentle holds at end range                     PT Long Term Goals - 05/30/16 1344    PT LONG TERM GOAL #1   Title Ind with a HEP.   Time 6   Period Weeks   Status Achieved   PT LONG TERM GOAL #2   Title Active left shoulder flexion to 155 degrees so the patient can easily reach overhead   Time 6   Period Weeks   Status Achieved   PT LONG TERM GOAL #3   Title Active ER to 80 degrees+ to allow for easily donning/doffing of apparel   Time 6   Period Weeks   Status On-going  AROM 62 degrees 05/21/16   PT LONG TERM GOAL #4   Title Increase ROM so patient is able to reach behind back to L3.   Time 6   Period Weeks   Status On-going   PT LONG TERM GOAL #  5   Title Increase left shoulder strength to a solid 4+/5 to increase stability for performance of functional activities   Time 6   Period Weeks   Status On-going   PT LONG TERM GOAL #6   Title Perform ADL's with left shoulder pain not > 3/10.   Time 6   Period Weeks   Status On-going             Patient will benefit from skilled therapeutic intervention in order to improve the following deficits and impairments:     Visit Diagnosis: Pain in left shoulder  Stiffness of left shoulder, not elsewhere classified     Problem List There are no active problems to display for this patient.   RAMSEUR,CHRIS, PTA 06/04/2016, 4:52 PM  [facility] [address] [place], [place], [address] Phone: [phone]    Fax:  [phone]  Name: Natalie Ortiz MRN: [id number] Date of Birth: 20-Mar-1981

## 2016-06-07 ENCOUNTER — Encounter: Payer: Self-pay | Admitting: Physical Therapy

## 2016-06-07 ENCOUNTER — Ambulatory Visit: Payer: BLUE CROSS/BLUE SHIELD | Admitting: Physical Therapy

## 2016-06-07 DIAGNOSIS — M25512 Pain in left shoulder: Secondary | ICD-10-CM

## 2016-06-07 DIAGNOSIS — M25612 Stiffness of left shoulder, not elsewhere classified: Secondary | ICD-10-CM

## 2016-06-07 NOTE — Therapy (Signed)
[facility] [address] [place], [place], [address] Phone: [phone]   Fax:  [phone]  Physical Therapy Treatment  Patient Details  Name: Natalie Ortiz MRN: [id number] Date of Birth: 09-12-1981 Referring Provider: Darreld McleanWayne Keeling MD  Encounter Date: 06/07/2016      PT End of Session - 06/07/16 1427    Visit Number 9   Number of Visits 12   Date for PT Re-Evaluation 06/18/16   PT Start Time 1402   PT Stop Time 1442   PT Time Calculation (min) 40 min   Activity Tolerance Patient tolerated treatment well   Behavior During Therapy [facility] for tasks assessed/performed      History reviewed. No pertinent past medical history.  History reviewed. No pertinent past surgical history.  There were no vitals filed for this visit.      Subjective Assessment - 06/07/16 1406    Subjective Doing better today. No pain in shldr, just every once in a while a twinge   Patient Stated Goals To be able to use my left arm without pain.   Currently in Pain? No/denies                         [facility]/Exercise - 06/07/16 0001    Shoulder Exercises: Seated   Elevation Strengthening;Left;Weights  x10, scaption x10   Elevation Weight (lbs) 1   Shoulder Exercises: Prone   Retraction Strengthening;Left;Weights  2x10   Retraction Weight (lbs) 1   Extension Strengthening;Left;Weights  2x10   Extension Weight (lbs) 1   Horizontal ABduction 1 Strengthening;Left;Weights  2x10   Horizontal ABduction 1 Weight (lbs) 1   Shoulder Exercises: Standing   Protraction Strengthening;Left;Theraband  3x10   Theraband Level (Shoulder Protraction) Level 1 (Yellow)   External Rotation Strengthening;Left;Theraband  3x10   Theraband Level (Shoulder External Rotation) Level 1 (Yellow)   Internal Rotation Strengthening;Left;Theraband  3x10   Theraband Level (Shoulder Internal Rotation) Level 1 (Yellow)   Extension  Strengthening;Left;Theraband  3x10   Theraband Level (Shoulder Extension) Level 1 (Yellow)   Row Strengthening;Left;Theraband  3x10   Theraband Level (Shoulder Row) Level 1 (Yellow)   Shoulder Exercises: ROM/Strengthening   UBE (Upper Arm Bike) 6 mins at 120 RPMs   Cryotherapy   Number Minutes Cryotherapy 15 Minutes   Cryotherapy Location Shoulder   Type of Cryotherapy Ice pack   Electrical Stimulation   Electrical Stimulation Location L shoulder   Electrical Stimulation Action IFC   Electrical Stimulation Parameters 1-10hz    Electrical Stimulation Goals Pain                     PT Long Term Goals - 05/30/16 1344    PT LONG TERM GOAL #1   Title Ind with a HEP.   Time 6   Period Weeks   Status Achieved   PT LONG TERM GOAL #2   Title Active left shoulder flexion to 155 degrees so the patient can easily reach overhead   Time 6   Period Weeks   Status Achieved   PT LONG TERM GOAL #3   Title Active ER to 80 degrees+ to allow for easily donning/doffing of apparel   Time 6   Period Weeks   Status On-going  AROM 62 degrees 05/21/16   PT LONG TERM GOAL #4   Title Increase ROM so patient is able to reach behind back to L3.   Time 6   Period Weeks  Status On-going   PT LONG TERM GOAL #5   Title Increase left shoulder strength to a solid 4+/5 to increase stability for performance of functional activities   Time 6   Period Weeks   Status On-going   PT LONG TERM GOAL #6   Title Perform ADL's with left shoulder pain not > 3/10.   Time 6   Period Weeks   Status On-going               Plan - 06/07/16 1428    Clinical Impression Statement Patient tolerated treatment well with some soreness and fatigue noted. Patient able to progress with strengthening exercises with no difficulty. Patient is back at work full time with light duty work [employer]. Patient continues to report occasional discomfort but not all the time. Patient has not returned to full ADL's or  heavy lifting at this time as she is awaiting for MD aproval. Requested ice and E/S after tx today. Goals ongoing today due to strength deficits.   Rehab Potential Excellent   Clinical Impairments Affecting Rehab Potential dislocation on 03/27/16 / current 11 weeks 06/08/2016   PT Frequency 2x / week   PT Duration 6 weeks   PT Treatment/Interventions ADLs/Self Care Home Management;Cryotherapy;Electrical Stimulation;Moist Heat;Therapeutic exercise;Therapeutic activities;Ultrasound;Neuromuscular re-education;Patient/family education;Manual techniques;Passive range of motion   PT Next Visit Plan continue with strengthening as tolerated. Modalities for pain. No overhead or heavy lifting (MD. Hilda LiasKeeling 06/12/16)   Consulted and Agree with Plan of Care Patient      Patient will benefit from skilled therapeutic intervention in order to improve the following deficits and impairments:  Pain, Decreased activity tolerance, Decreased range of motion  Visit Diagnosis: Pain in left shoulder  Stiffness of left shoulder, not elsewhere classified     Problem List There are no active problems to display for this patient.   APPLEGATE, ItalyHAD, PTA 06/07/2016, 5:58 PM [place] MPT [facility] [address] [place], [place], [address] Phone: [phone]   Fax:  [phone]  Name: Natalie Ortiz MRN: [id number] Date of Birth: Apr 25, 1981

## 2016-06-12 ENCOUNTER — Ambulatory Visit (INDEPENDENT_AMBULATORY_CARE_PROVIDER_SITE_OTHER): Payer: Worker's Compensation | Admitting: Orthopaedic Surgery

## 2016-06-12 ENCOUNTER — Encounter: Payer: Self-pay | Admitting: Orthopaedic Surgery

## 2016-06-12 VITALS — BP 118/80 | HR 56 | Temp 97.2°F | Ht 62.0 in | Wt 182.0 lb

## 2016-06-12 DIAGNOSIS — M25512 Pain in left shoulder: Secondary | ICD-10-CM

## 2016-06-12 NOTE — Progress Notes (Signed)
Patient ZO:XWRUEAVW:Natalie Ortiz, female DOB:[birth year], 35 y.o. UJW:[id number]  Chief Complaint  Patient presents with  . Follow-up    left shoulder pain DOI: 03/27/16    HPI  Natalie JacksJennifer Ortiz is a 35 y.o. female who is much improved with the left shoulder pain.  She went back to work on 05-23-16 and has been doing well.  She has no pain now, full motion, no difficulty at all.  HPI  Body mass index is 33.28 kg/(m^2).  ROS  Review of Systems  HENT: Negative for congestion.   Respiratory: Negative for cough and shortness of breath.   Cardiovascular: Negative for chest pain and leg swelling.  Endocrine: Negative for cold intolerance.  Musculoskeletal: Positive for arthralgias.  Allergic/Immunologic: Negative for environmental allergies.  Neurological: Negative for numbness.  All other systems reviewed and are negative.   History reviewed. No pertinent past medical history.  History reviewed. No pertinent past surgical history.  History reviewed. No pertinent family history.  Social History Social History  Substance Use Topics  . Smoking status: Never Smoker   . Smokeless tobacco: None  . Alcohol Use: Yes     Comment: rare    No Known Allergies  Current Outpatient Prescriptions  Medication Sig Dispense Refill  . norgestimate-ethinyl estradiol (SPRINTEC 28) 0.25-35 MG-MCG tablet Take 1 tablet by mouth daily.     No current facility-administered medications for this visit.     Physical Exam  Blood pressure 118/80, pulse 56, temperature 97.2 F (36.2 C), height 5\' 2"  (1.575 m), weight 182 lb (82.555 kg).  Constitutional: overall normal hygiene, normal nutrition, well developed, normal grooming, normal body habitus. Assistive device:none  Musculoskeletal: gait and station Limp none, muscle tone and strength are normal, no tremors or atrophy is present.  .  Neurological: coordination overall normal.  Deep tendon reflex/nerve stretch intact.  Sensation normal.  Cranial  nerves II-XII intact.   Skin:   normal overall no scars, lesions, ulcers or rashes. No psoriasis.  Psychiatric: Alert and oriented x 3.  Recent memory intact, remote memory unclear.  Normal mood and affect. Well groomed.  Good eye contact.  Cardiovascular: overall no swelling, no varicosities, no edema bilaterally, normal temperatures of the legs and arms, no clubbing, cyanosis and good capillary refill.  Lymphatic: palpation is normal.  Examination of left Upper Extremity is done.  Inspection:   Overall:  Elbow non-tender without crepitus or defects, forearm non-tender without crepitus or defects, wrist non-tender without crepitus or defects, hand non-tender.    Shoulder: without glenohumeral joint tenderness, without effusion.   Upper arm: without swelling and tenderness   Range of motion:   Overall:  Full range of motion of the elbow, full range of motion of wrist and full range of motion in fingers.   Shoulder:  left  full degrees forward flexion; full degrees abduction; full degrees internal rotation, full degrees external rotation, full degrees extension, full degrees adduction.   Stability:   Overall:  Shoulder, elbow and wrist stable   Strength and Tone:   Overall full shoulder muscles strength, full upper arm strength and normal upper arm bulk and tone.   The patient has been educated about the nature of the problem(s) and counseled on treatment options.  The patient appeared to understand what I have discussed and is in agreement with it.  Encounter Diagnosis  Name Primary?  . Left shoulder pain Yes    PLAN Call if any problems.  Precautions discussed.  Continue current medications.   Return to  clinic PRN  Electronically Signed Darreld McleanWayne Frederico Gerling, MD 6/27/201710:38 AM

## 2016-06-12 NOTE — Patient Instructions (Addendum)
Continue exercises.  Return to work with no restrictions.

## 2016-06-12 NOTE — Telephone Encounter (Signed)
Patient was seen as scheduled, so as to not delay medical treatment, and guarantor of Workers comp was entered with employer's address as the responsible party, although no return call from employer.  Patient aware of status.

## 2016-06-13 ENCOUNTER — Encounter: Payer: Self-pay | Admitting: Physical Therapy

## 2016-06-13 NOTE — Therapy (Signed)
[facility] [place], [place], [address] Phone: [phone]   Fax:  [phone]  Physical Therapy Treatment  Patient Details  Name: Natalie Ortiz MRN: [id number] Date of Birth: Sep 23, 1981 Referring Provider: Sanjuana Kava MD  Encounter Date: 06/07/2016    History reviewed. No pertinent past medical history.  History reviewed. No pertinent past surgical history.  There were no vitals filed for this visit.                                    PT Long Term Goals - 05/30/16 1344    PT LONG TERM GOAL #1   Title Ind with a HEP.   Time 6   Period Weeks   Status Achieved   PT LONG TERM GOAL #2   Title Active left shoulder flexion to 155 degrees so the patient can easily reach overhead   Time 6   Period Weeks   Status Achieved   PT LONG TERM GOAL #3   Title Active ER to 80 degrees+ to allow for easily donning/doffing of apparel   Time 6   Period Weeks   Status On-going  AROM 62 degrees 05/21/16   PT LONG TERM GOAL #4   Title Increase ROM so patient is able to reach behind back to L3.   Time 6   Period Weeks   Status On-going   PT LONG TERM GOAL #5   Title Increase left shoulder strength to a solid 4+/5 to increase stability for performance of functional activities   Time 6   Period Weeks   Status On-going   PT LONG TERM GOAL #6   Title Perform ADL's with left shoulder pain not > 3/10.   Time 6   Period Weeks   Status On-going             Patient will benefit from skilled therapeutic intervention in order to improve the following deficits and impairments:  Pain, Decreased activity tolerance, Decreased range of motion  Visit Diagnosis: Pain in left shoulder  Stiffness of left shoulder, not elsewhere classified     Problem List There are no active problems to display for this patient.  PHYSICAL THERAPY DISCHARGE SUMMARY  Visits from Start of Care:   Current  functional level related to goals / functional outcomes: Please see above.   Remaining deficits: Patient continues to lack left shoulder ROM and strength.   Education / Equipment: HEP.  Plan: Patient agrees to discharge.  Patient goals were partially met. Patient is being discharged due to the physician's request.  ?????      Brondon Wann, [place] MPT 06/13/2016, 4:23 PM  [facility] [place], [place], [address] Phone: [phone]   Fax:  [phone]  Name: Natalie Ortiz MRN: [id number] Date of Birth: 05-06-1981

## 2016-06-15 ENCOUNTER — Encounter: Payer: Self-pay | Admitting: Physical Therapy

## 2016-08-07 DIAGNOSIS — Z01419 Encounter for gynecological examination (general) (routine) without abnormal findings: Secondary | ICD-10-CM | POA: Diagnosis not present

## 2016-08-07 DIAGNOSIS — Z6833 Body mass index (BMI) 33.0-33.9, adult: Secondary | ICD-10-CM | POA: Diagnosis not present

## 2017-05-08 DIAGNOSIS — M25562 Pain in left knee: Secondary | ICD-10-CM | POA: Diagnosis not present

## 2017-05-08 DIAGNOSIS — M25572 Pain in left ankle and joints of left foot: Secondary | ICD-10-CM | POA: Diagnosis not present

## 2017-05-27 IMAGING — CR DG ANKLE COMPLETE 3+V*L*
3 series · 3 of 3 positions shown · non-contrast
Comparison: None.

CLINICAL DATA: Medial ankle pain, no known injury

EXAM:
LEFT ANKLE COMPLETE - 3+ VIEW

[view not recorded (1 of 3)]
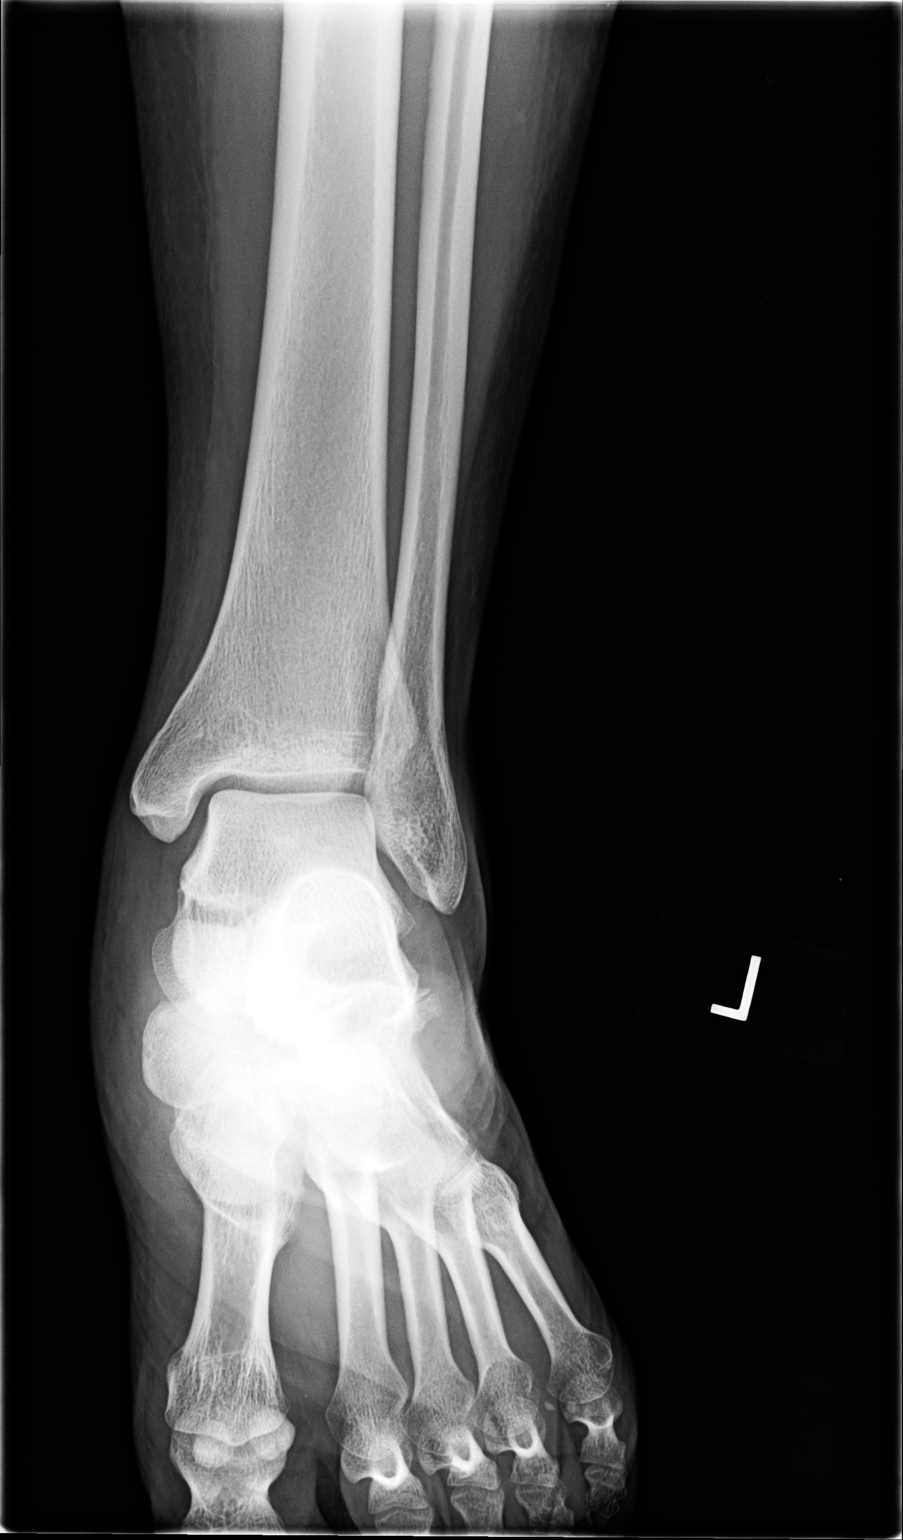

[view not recorded (2 of 3)]
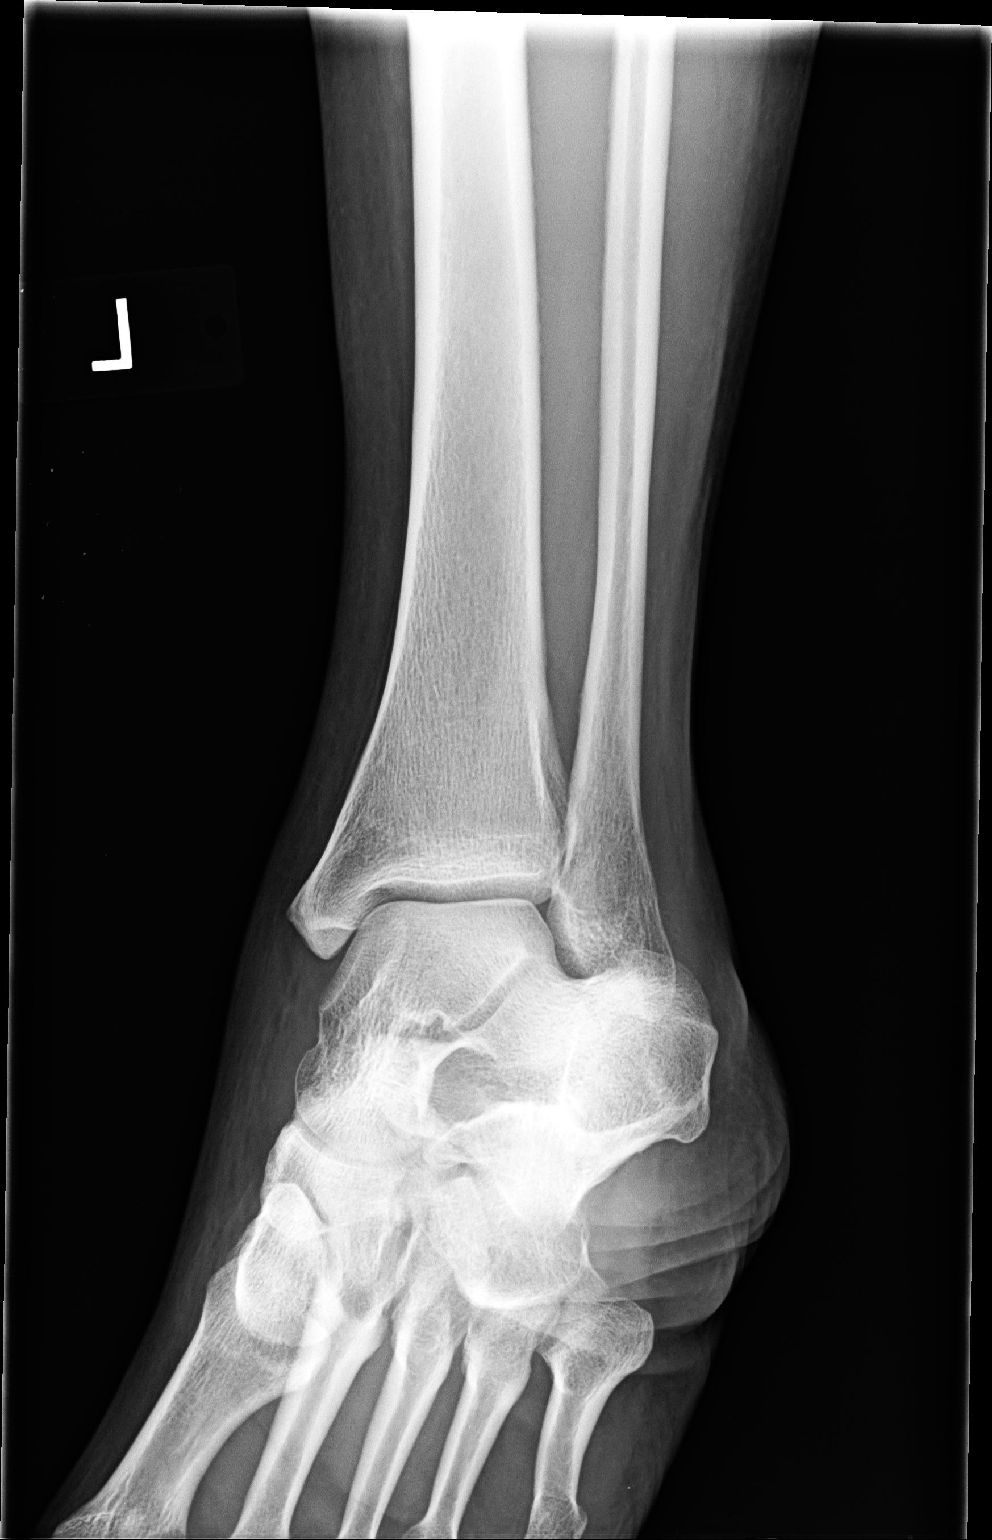

[view not recorded (3 of 3)]
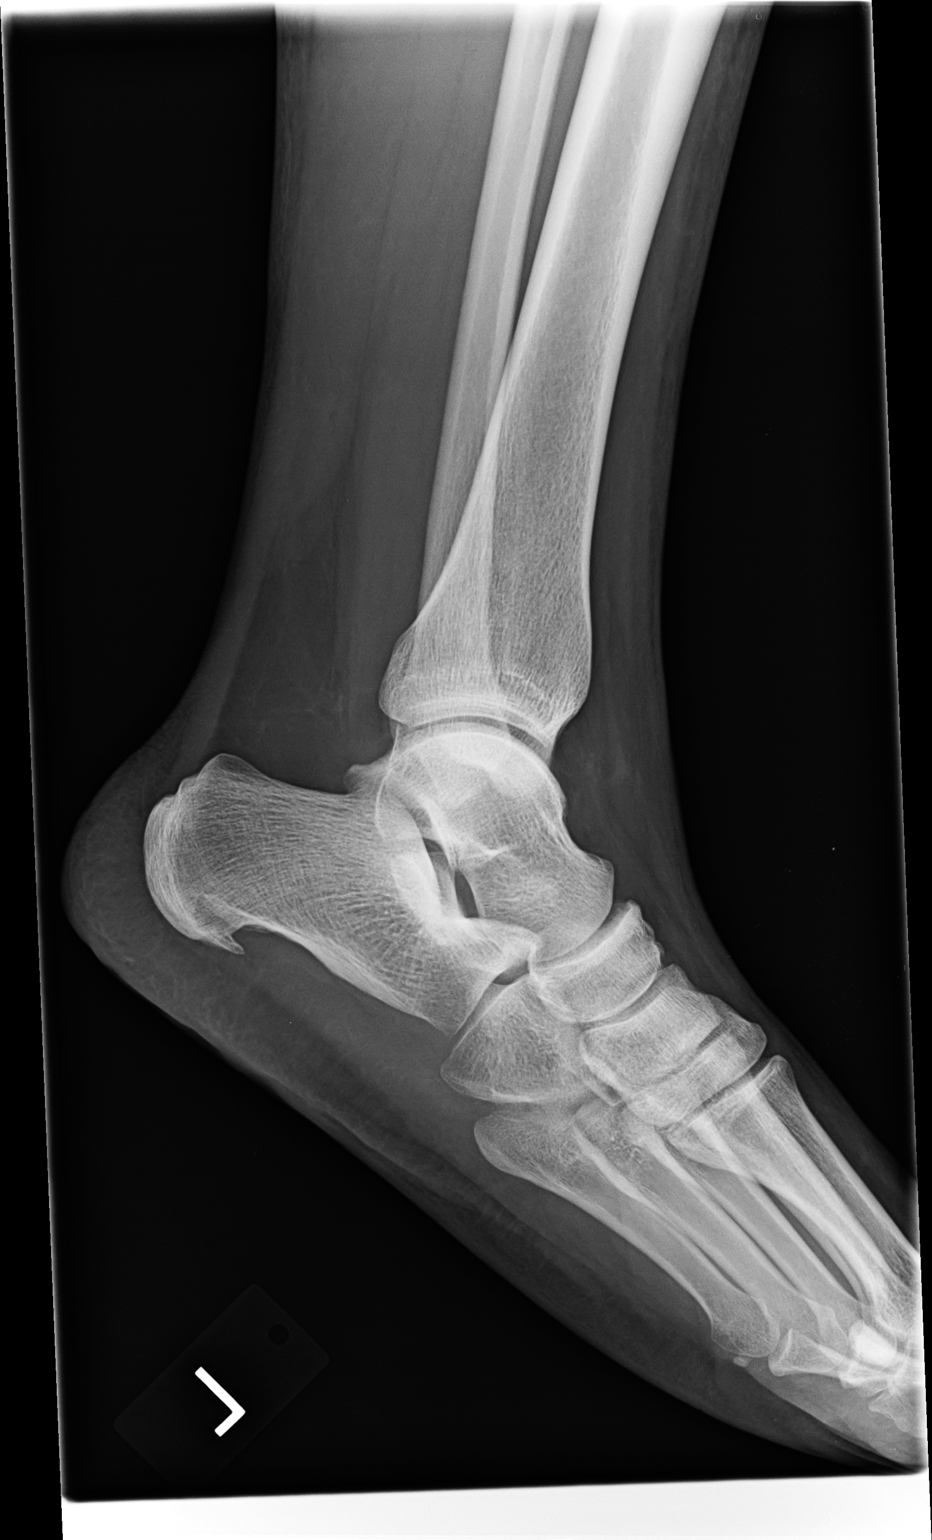

[3 of 3 positions shown; findings below may reference images not displayed]

FINDINGS: Three views of the left ankle submitted. No acute fracture or
subluxation. There is plantar spur of calcaneus. Ankle mortise is
preserved.
IMPRESSION: No acute fracture or subluxation. Plantar spurring of calcaneus.
Ankle mortise is preserved.

## 2017-09-09 DIAGNOSIS — Z01419 Encounter for gynecological examination (general) (routine) without abnormal findings: Secondary | ICD-10-CM | POA: Diagnosis not present

## 2017-09-09 DIAGNOSIS — Z6837 Body mass index (BMI) 37.0-37.9, adult: Secondary | ICD-10-CM | POA: Diagnosis not present

## 2017-12-14 ENCOUNTER — Encounter: Payer: Self-pay | Admitting: Physician Assistant

## 2017-12-14 ENCOUNTER — Ambulatory Visit: Payer: BLUE CROSS/BLUE SHIELD | Admitting: Physician Assistant

## 2017-12-14 VITALS — BP 105/75 | HR 74 | Temp 98.9°F | Ht 62.0 in | Wt 180.0 lb

## 2017-12-14 DIAGNOSIS — R102 Pelvic and perineal pain: Secondary | ICD-10-CM

## 2017-12-14 DIAGNOSIS — N3281 Overactive bladder: Secondary | ICD-10-CM | POA: Diagnosis not present

## 2017-12-14 MED ORDER — MIRABEGRON ER 25 MG PO TB24: 25.0000 mg | ORAL_TABLET | Freq: Every day | ORAL | 0 refills | Status: DC

## 2017-12-14 NOTE — Patient Instructions (Signed)
In a few days you may receive a survey in the mail or online from [employer] regarding your visit with us today. Please take a moment to fill this out. Your feedback is very important to our whole office. It can help us better understand your needs as well as improve your experience and satisfaction. Thank you for taking your time to complete it. We care about you.  Kortni Hasten, PA-C  

## 2017-12-16 ENCOUNTER — Encounter: Payer: Self-pay | Admitting: Physician Assistant

## 2017-12-16 NOTE — Progress Notes (Signed)
BP 105/75   Pulse 74   Temp 98.9 F (37.2 C) (Oral)   Ht 5\' 2"  (1.575 m)   Wt 180 lb (81.6 kg)   BMI 32.92 kg/m    Subjective:    Patient ID: Natalie JacksJennifer Ortiz, female    DOB: [birth year], 36 y.o.   MRN: [id number]  HPI: Natalie Ortiz is a 36 y.o. female presenting on 12/14/2017 for Abdominal Pain (lower abd pain and irregular vaginal bleeding for a few weeks. on Camilla OCP. ) Irregular menses that could not be predicted, has gone on a few weeks. She has vary bad pain in the lower abdomen over the uterus and both ovaries. No known history of cysts or endometriosis.  Recommend pelvic ultrasound.  Longstanding urine frequency regardless of diet or drink. Will get up to go 2-3 times per night.  She has never taken a medication.  Relevant past medical, surgical, family and social history reviewed and updated as indicated. Allergies and medications reviewed and updated.  History reviewed. No pertinent past medical history.  History reviewed. No pertinent surgical history.  Review of Systems  Constitutional: Negative.  Negative for activity change, fatigue and fever.  HENT: Negative.   Eyes: Negative.   Respiratory: Negative.  Negative for cough.   Cardiovascular: Negative.  Negative for chest pain.  Gastrointestinal: Negative.  Negative for abdominal pain.  Endocrine: Negative.   Genitourinary: Positive for menstrual problem, pelvic pain, urgency and vaginal pain. Negative for decreased urine volume, dysuria, flank pain, vaginal bleeding and vaginal discharge.  Musculoskeletal: Negative.   Skin: Negative.   Neurological: Negative.     Allergies as of 12/14/2017   No Known Allergies     Medication List        Accurate as of 12/14/17 11:59 PM. Always use your most recent med list.          mirabegron ER 25 MG Tb24 tablet Commonly known as:  MYRBETRIQ Take 1 tablet (25 mg total) by mouth daily.          Objective:    BP 105/75   Pulse 74   Temp 98.9 F (37.2  C) (Oral)   Ht 5\' 2"  (1.575 m)   Wt 180 lb (81.6 kg)   BMI 32.92 kg/m   No Known Allergies  Physical Exam  Constitutional: She is oriented to person, place, and time. She appears well-developed and well-nourished.  HENT:  Head: Normocephalic and atraumatic.  Right Ear: Tympanic membrane, external ear and ear canal normal.  Left Ear: Tympanic membrane, external ear and ear canal normal.  Nose: Nose normal. No rhinorrhea.  Mouth/Throat: Oropharynx is clear and moist and mucous membranes are normal. No oropharyngeal exudate or posterior oropharyngeal erythema.  Eyes: Conjunctivae and EOM are normal. Pupils are equal, round, and reactive to light.  Neck: Normal range of motion. Neck supple.  Cardiovascular: Normal rate, regular rhythm, normal heart sounds and intact distal pulses.  Pulmonary/Chest: Effort normal and breath sounds normal.  Abdominal: Soft. Bowel sounds are normal. She exhibits no distension and no mass. There is tenderness. There is no rebound and no guarding.  Neurological: She is alert and oriented to person, place, and time. She has normal reflexes.  Skin: Skin is warm and dry. No rash noted.  Psychiatric: She has a normal mood and affect. Her behavior is normal. Judgment and thought content normal.    No results found for this or any previous visit.    Assessment & Plan:   1. OAB (overactive  bladder) - mirabegron ER (MYRBETRIQ) 25 MG TB24 tablet; Take 1 tablet (25 mg total) by mouth daily.  Dispense: 21 tablet; Refill: 0  2. Pelvic pain - US Pelvis Complete; Future    Current Outpatient Medications:  .  mirabegron ER (MYRBETRIQ) 25 MG TB24 tablet, Take 1 tablet (25 mg total) by mouth daily., Disp: 21 tablet, Rfl: 0 Continue all other maintenance medications as listed above.  Follow up plan: Return if symptoms worsen or fail to improve.  Educational handout given for survey  Remus LofflerAngel S. Arrow Emmerich PA-C [facility] [address]    [place], [place] [id number] [phone]   12/16/2017, 2:00 PM

## 2017-12-24 ENCOUNTER — Ambulatory Visit (HOSPITAL_COMMUNITY): Admission: RE | Admit: 2017-12-24 | Payer: BLUE CROSS/BLUE SHIELD | Source: Ambulatory Visit

## 2018-01-01 ENCOUNTER — Ambulatory Visit (HOSPITAL_COMMUNITY)
Admission: RE | Admit: 2018-01-01 | Discharge: 2018-01-01 | Disposition: A | Discharge status: Home / Self Care | Payer: BLUE CROSS/BLUE SHIELD | Source: Ambulatory Visit | Attending: Physician Assistant | Admitting: Physician Assistant

## 2018-01-01 DIAGNOSIS — R102 Pelvic and perineal pain: Secondary | ICD-10-CM

## 2018-01-01 DIAGNOSIS — N854 Malposition of uterus: Secondary | ICD-10-CM | POA: Insufficient documentation

## 2018-01-11 ENCOUNTER — Telehealth: Payer: Self-pay | Admitting: Family Medicine

## 2018-01-13 DIAGNOSIS — Z6838 Body mass index (BMI) 38.0-38.9, adult: Secondary | ICD-10-CM | POA: Diagnosis not present

## 2018-01-13 DIAGNOSIS — N938 Other specified abnormal uterine and vaginal bleeding: Secondary | ICD-10-CM | POA: Diagnosis not present

## 2018-01-13 NOTE — Telephone Encounter (Signed)
Ran a copy of US and gave to the patient.

## 2018-03-03 ENCOUNTER — Emergency Department (HOSPITAL_COMMUNITY)
Admission: EM | Admit: 2018-03-03 | Discharge: 2018-03-03 | Disposition: A | Discharge status: Home / Self Care | Payer: BLUE CROSS/BLUE SHIELD | Attending: Emergency Medicine | Admitting: Emergency Medicine

## 2018-03-03 ENCOUNTER — Encounter (HOSPITAL_COMMUNITY): Payer: Self-pay | Admitting: Emergency Medicine

## 2018-03-03 DIAGNOSIS — M545 Low back pain, unspecified: Secondary | ICD-10-CM

## 2018-03-03 DIAGNOSIS — Z79899 Other long term (current) drug therapy: Secondary | ICD-10-CM | POA: Insufficient documentation

## 2018-03-03 DIAGNOSIS — Y929 Unspecified place or not applicable: Secondary | ICD-10-CM | POA: Insufficient documentation

## 2018-03-03 DIAGNOSIS — Y999 Unspecified external cause status: Secondary | ICD-10-CM | POA: Insufficient documentation

## 2018-03-03 DIAGNOSIS — Y939 Activity, unspecified: Secondary | ICD-10-CM | POA: Diagnosis not present

## 2018-03-03 LAB — RAPID URINE DRUG SCREEN, HOSP PERFORMED
Amphetamines: NOT DETECTED
Barbiturates: NOT DETECTED
Benzodiazepines: NOT DETECTED
Cocaine: NOT DETECTED
Opiates: NOT DETECTED
Tetrahydrocannabinol: NOT DETECTED

## 2018-03-03 LAB — ETHANOL: Alcohol, Ethyl (B): <10 mg/dL (ref <10)

## 2018-03-03 NOTE — Discharge Instructions (Signed)
Take Ibuprofen or Tylenol for pain Use a heating pad for sore muscles - use for 20 minutes several times a day Return for worsening symptoms
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
## 2018-09-15 DIAGNOSIS — Z3202 Encounter for pregnancy test, result negative: Secondary | ICD-10-CM | POA: Diagnosis not present

## 2018-09-15 DIAGNOSIS — Z6838 Body mass index (BMI) 38.0-38.9, adult: Secondary | ICD-10-CM | POA: Diagnosis not present

## 2018-09-15 DIAGNOSIS — Z01419 Encounter for gynecological examination (general) (routine) without abnormal findings: Secondary | ICD-10-CM | POA: Diagnosis not present

## 2018-11-07 IMAGING — US US PELVIS COMPLETE
1 series · 14 of 25 positions shown · non-contrast
Comparison: None in PACs

CLINICAL DATA: 5-6 weeks of pelvic pain with irregular bleeding.

EXAM:
TRANSABDOMINAL ULTRASOUND OF PELVIS
TECHNIQUE: Transabdominal ultrasound examination of the pelvis was performed
including evaluation of the uterus, ovaries, adnexal regions, and
pelvic cul-de-sac.

[Series 1: us pelvis complete · 0.28mm/px · 14 of 31 slices shown]
[im 1/31]
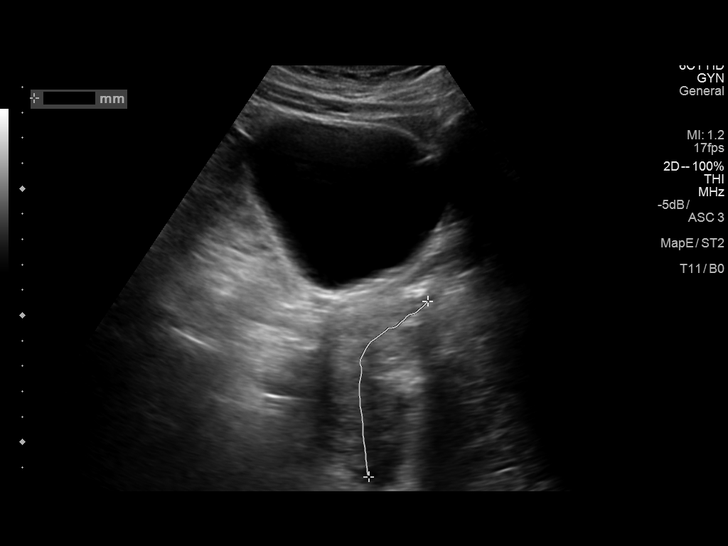
[im 3/31]
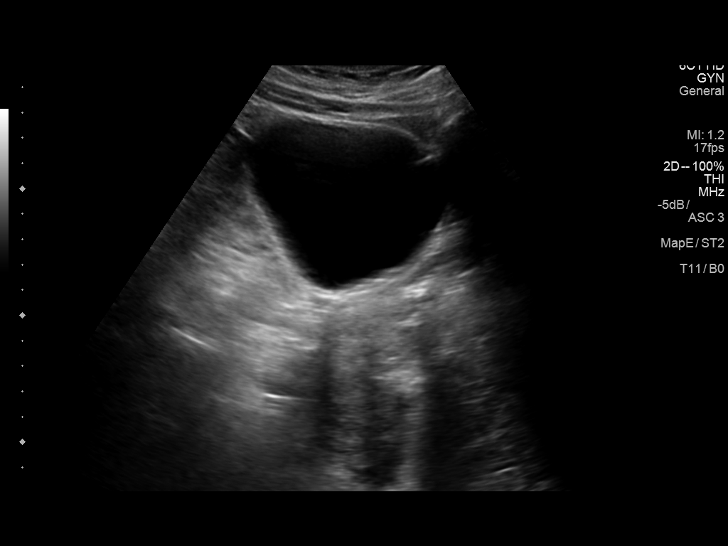
[im 6/31]
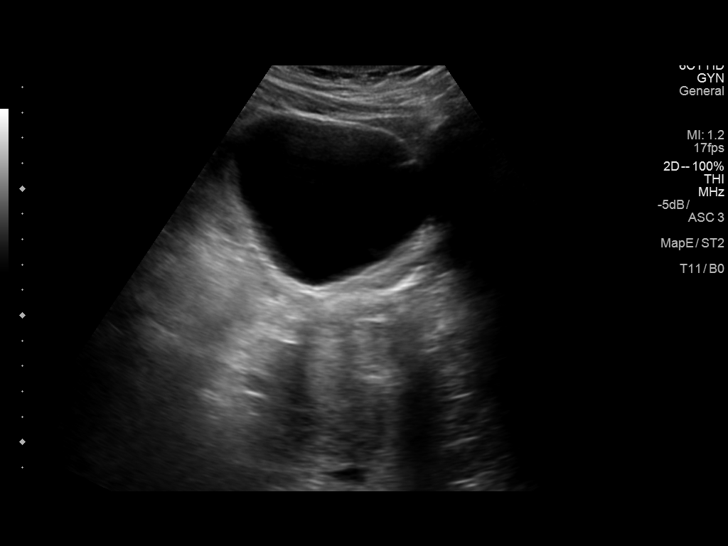
[im 8/31]
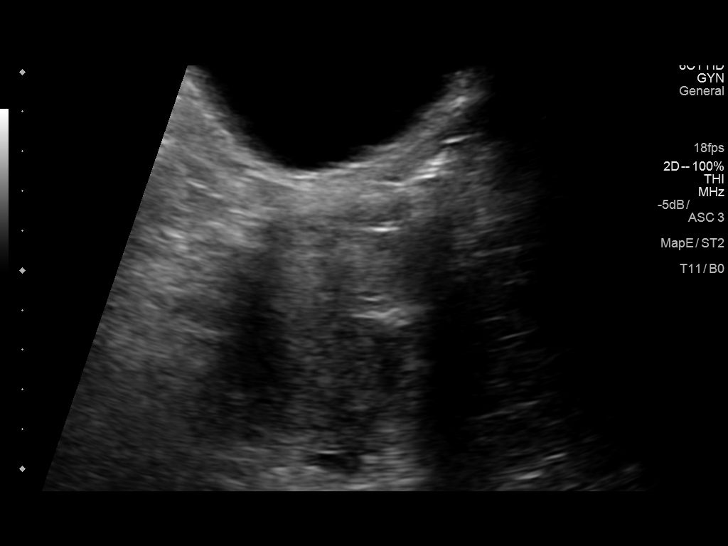
[im 11/31]
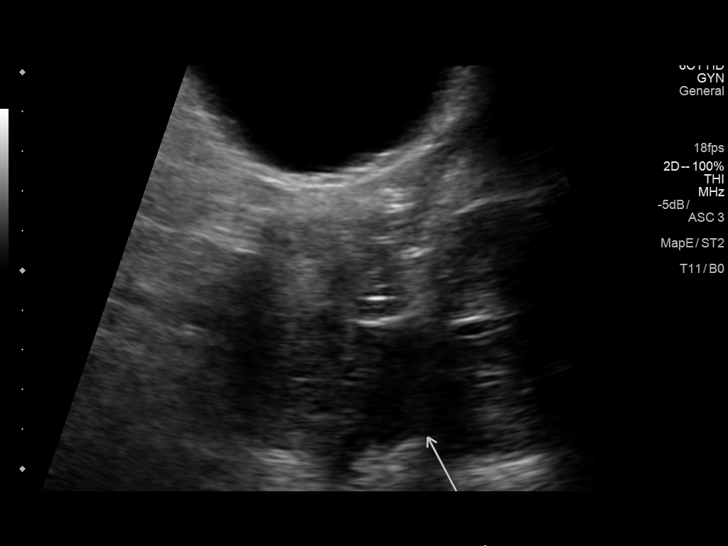
[im 12/31]
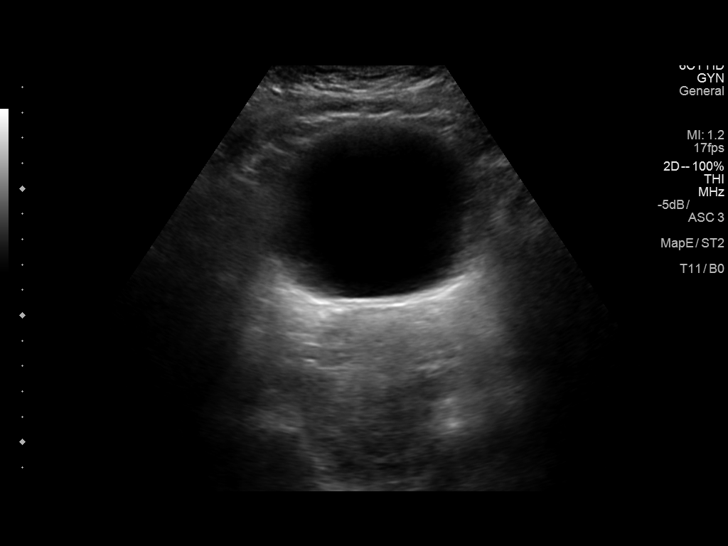
[im 14/31]
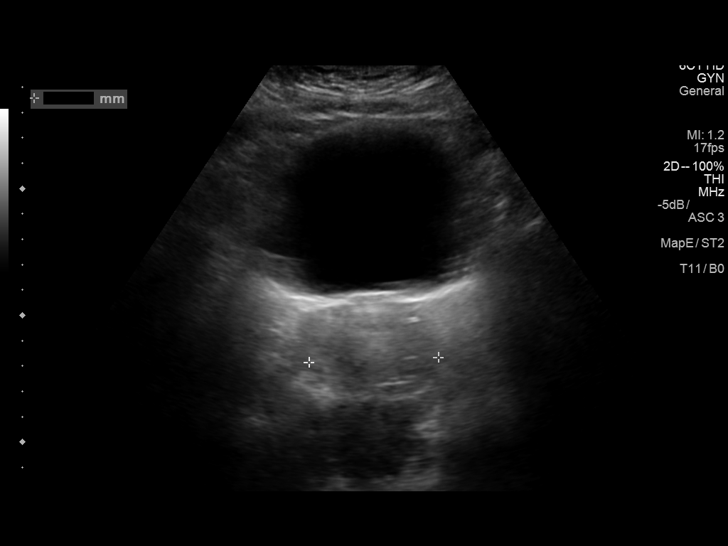
[im 17/31]
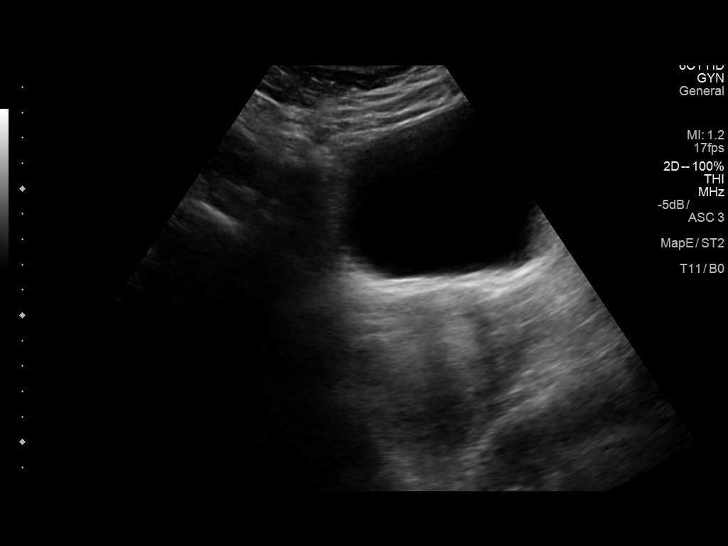
[im 19/31]
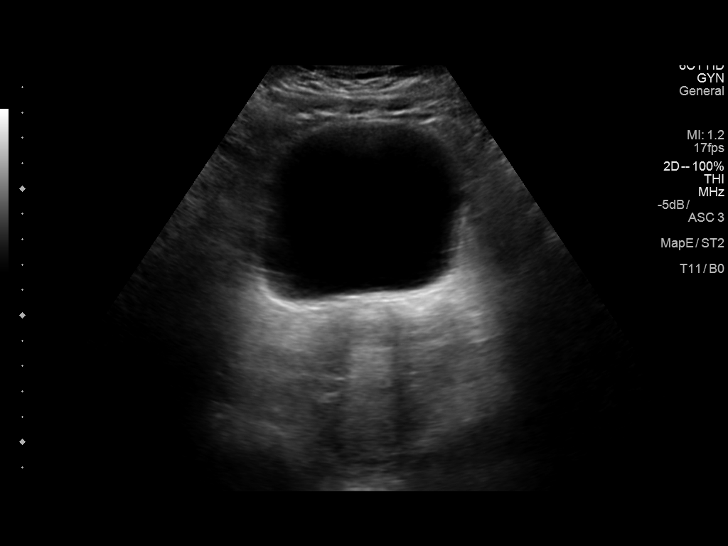
[im 21/31]
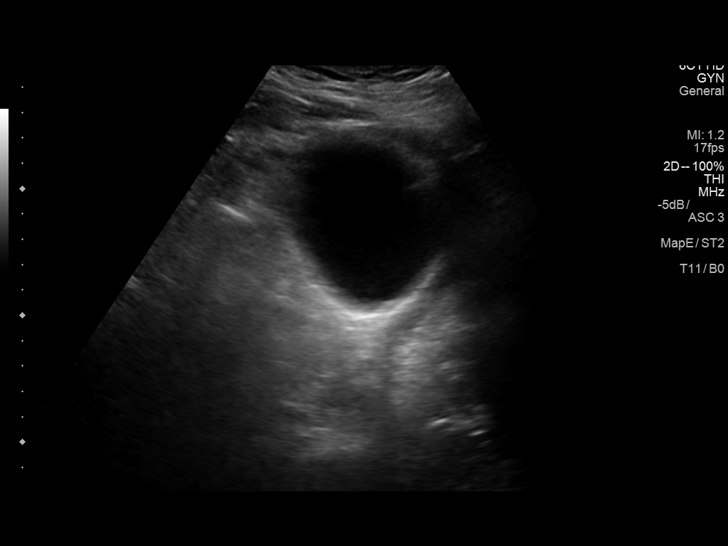
[im 23/31]
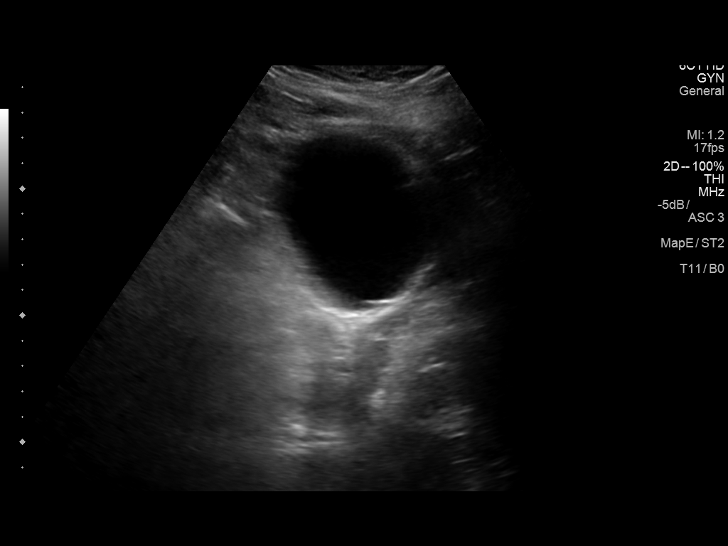
[im 26/31]
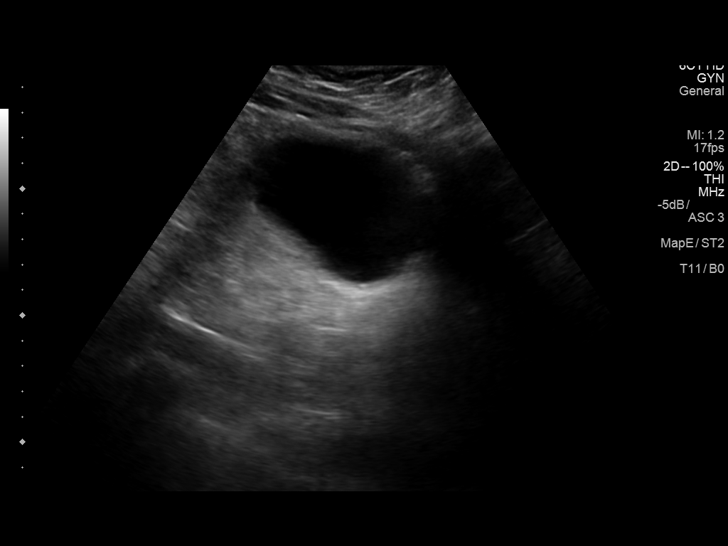
[im 28/31]
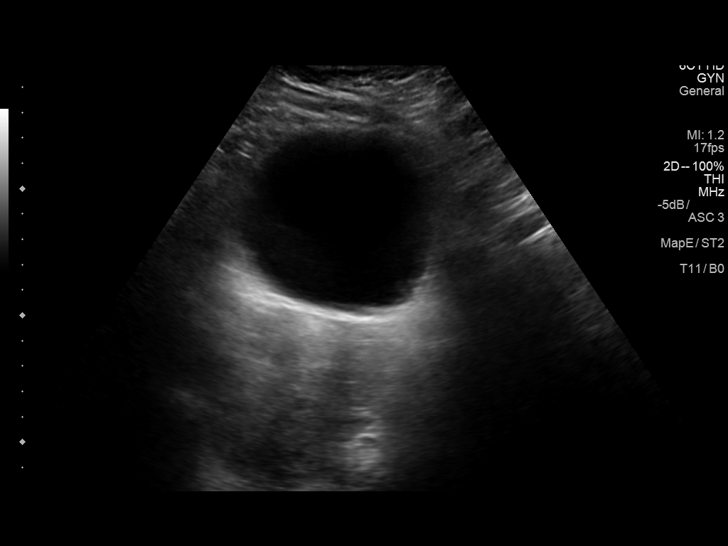
[im 31/31]
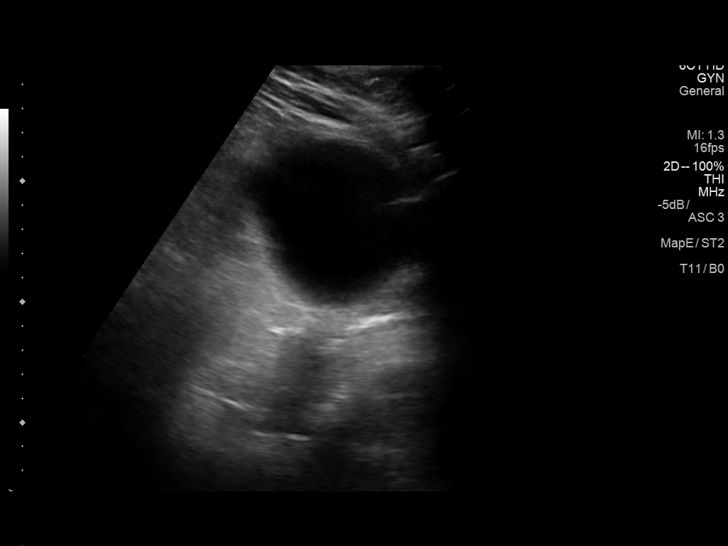

[14 of 25 positions shown; findings below may reference images not displayed]

FINDINGS: Uterus

Measurements: 8.3 x 3.7 x 5.1 cm.. The uterus is retroverted. The
myometrial echotexture is normal. No discrete fibroids are observed.

Endometrium

Thickness: 12.4 mm.  No endometrial fluid collections are observed.

Right ovary

The right ovary could not be visualized.

Left ovary

The left ovary could not be visualized.

Other findings: There is a moderate amount of free pelvic fluid.
IMPRESSION: Retroverted uterus. Mild endometrial stripe thickening to 12.4 mm.
If bleeding remains unresponsive to hormonal or medical therapy,
sonohysterogram should be considered for focal lesion work-up. (Ref:
Radiological Reasoning: Algorithmic Workup of Abnormal Vaginal
Bleeding with Endovaginal Sonography and Sonohysterography. AJR
[id number]; 191:S68-73)

Nonvisualization of the ovaries

Moderate amount of free pelvic fluid.

## 2019-05-28 ENCOUNTER — Ambulatory Visit: Payer: Self-pay

## 2019-05-28 DIAGNOSIS — Z6836 Body mass index (BMI) 36.0-36.9, adult: Secondary | ICD-10-CM | POA: Diagnosis not present

## 2019-05-28 DIAGNOSIS — T7840XA Allergy, unspecified, initial encounter: Secondary | ICD-10-CM | POA: Diagnosis not present

## 2019-05-28 DIAGNOSIS — H65111 Acute and subacute allergic otitis media (mucoid) (sanguinous) (serous), right ear: Secondary | ICD-10-CM | POA: Diagnosis not present

## 2019-06-03 DIAGNOSIS — N762 Acute vulvitis: Secondary | ICD-10-CM | POA: Diagnosis not present

## 2019-06-03 DIAGNOSIS — Z6837 Body mass index (BMI) 37.0-37.9, adult: Secondary | ICD-10-CM | POA: Diagnosis not present

## 2019-06-03 DIAGNOSIS — N9089 Other specified noninflammatory disorders of vulva and perineum: Secondary | ICD-10-CM | POA: Diagnosis not present

## 2019-06-09 DIAGNOSIS — N9089 Other specified noninflammatory disorders of vulva and perineum: Secondary | ICD-10-CM | POA: Diagnosis not present

## 2019-06-09 DIAGNOSIS — Z6836 Body mass index (BMI) 36.0-36.9, adult: Secondary | ICD-10-CM | POA: Diagnosis not present

## 2019-07-27 DIAGNOSIS — M7989 Other specified soft tissue disorders: Secondary | ICD-10-CM | POA: Diagnosis not present

## 2019-07-27 DIAGNOSIS — N926 Irregular menstruation, unspecified: Secondary | ICD-10-CM | POA: Diagnosis not present

## 2019-07-27 DIAGNOSIS — Z6836 Body mass index (BMI) 36.0-36.9, adult: Secondary | ICD-10-CM | POA: Diagnosis not present

## 2019-07-27 DIAGNOSIS — M7732 Calcaneal spur, left foot: Secondary | ICD-10-CM | POA: Diagnosis not present

## 2019-07-27 DIAGNOSIS — M25572 Pain in left ankle and joints of left foot: Secondary | ICD-10-CM | POA: Diagnosis not present

## 2019-10-15 ENCOUNTER — Telehealth: Payer: Self-pay | Admitting: Family Medicine

## 2019-10-15 NOTE — Telephone Encounter (Signed)
Patient states that she was exposed to someone at work that tested positive for COVID.  States she does not have any symptoms but is worried about her family. Patient aware to self insulate until her results are back.

## 2019-10-16 ENCOUNTER — Other Ambulatory Visit: Payer: Self-pay

## 2019-10-16 DIAGNOSIS — Z20822 Contact with and (suspected) exposure to covid-19: Secondary | ICD-10-CM

## 2019-10-17 LAB — NOVEL CORONAVIRUS, NAA: SARS-CoV-2, NAA: NOT DETECTED

## 2020-03-21 ENCOUNTER — Encounter: Payer: Self-pay | Admitting: Family Medicine

## 2020-03-21 ENCOUNTER — Ambulatory Visit: Payer: BLUE CROSS/BLUE SHIELD | Admitting: Family Medicine

## 2020-03-21 ENCOUNTER — Other Ambulatory Visit: Payer: Self-pay

## 2020-03-21 VITALS — BP 127/85 | HR 77 | Temp 98.0°F | Ht 61.0 in | Wt 211.0 lb

## 2020-03-21 DIAGNOSIS — B351 Tinea unguium: Secondary | ICD-10-CM | POA: Diagnosis not present

## 2020-03-21 DIAGNOSIS — B353 Tinea pedis: Secondary | ICD-10-CM

## 2020-03-21 MED ORDER — TOLNAFTATE 1 % EX POWD: 1.0000 "application " | Freq: Two times a day (BID) | CUTANEOUS | 0 refills | Status: DC

## 2020-03-21 NOTE — Progress Notes (Signed)
Subjective  HPI: Ms. Asbill is a 39 year old female who presents to clinic today with chief complaint of white spots between her toes x 2-3 weeks. The first white spots appeared between her 4th and 5th toes on her left foot about 3 weeks ago and have spread to her right foot approximately 1 week ago. The spots were itchy for 2-3 days but are not currently itchy or painful. She has been applying Lamisil cream to the white spots without any relief. Ms. Gossen also reports a darkening of the toenail on both of her big toes.  PMH: She had an episode of this skin condition in the past which resolved without treatment. She has a PMH of dry skin.  ROS: She denies any other white spots on any other areas of her body (isolated to the toes).  Objective  Vital Signs = BP: 127/85, HR: 77, O2: 100%, Temp: 98 F, H: 5'1", W: 95.7 kg  General: Patient appears alert, cooperative, and in no acute distress. Extremities: +White, flaky patches in between toes of both feet, - bleeding skin between toes  Assessment  Athlete's Foot (Tinea Pedis), bilateral Onychomycosis, bilateral  Plan  Keep feet dry by wearing shoes with good aeration and keeping socks off when going to bed at night. Apply Terbinafine powder to affected area daily. Also recommend putting Terbinafine powder in the patients' shoes. Apply Terbinafine powder to darkened toenails for onychomycosis.  Patient seen and examined with Haywood Lasso, PA student.  Agree with assessment and plan above.  We will treat the onychomycosis conservatively and will give terbinafine powder for the patient, also recommend that she possibly consider spray.  Put it in her shoes and on her feet twice daily. Arville Care, MD [facility] 03/21/2020, 3:36 PM

## 2020-05-19 DIAGNOSIS — Z6837 Body mass index (BMI) 37.0-37.9, adult: Secondary | ICD-10-CM | POA: Diagnosis not present

## 2020-05-19 DIAGNOSIS — R102 Pelvic and perineal pain: Secondary | ICD-10-CM | POA: Diagnosis not present

## 2020-05-19 DIAGNOSIS — Z01419 Encounter for gynecological examination (general) (routine) without abnormal findings: Secondary | ICD-10-CM | POA: Diagnosis not present

## 2020-05-30 DIAGNOSIS — N926 Irregular menstruation, unspecified: Secondary | ICD-10-CM | POA: Diagnosis not present

## 2020-05-30 DIAGNOSIS — R102 Pelvic and perineal pain: Secondary | ICD-10-CM | POA: Diagnosis not present

## 2020-05-30 DIAGNOSIS — Z6838 Body mass index (BMI) 38.0-38.9, adult: Secondary | ICD-10-CM | POA: Diagnosis not present

## 2020-05-30 DIAGNOSIS — N800 Endometriosis of uterus: Secondary | ICD-10-CM | POA: Diagnosis not present

## 2021-05-25 DIAGNOSIS — Z01419 Encounter for gynecological examination (general) (routine) without abnormal findings: Secondary | ICD-10-CM | POA: Diagnosis not present

## 2021-05-25 DIAGNOSIS — Z6837 Body mass index (BMI) 37.0-37.9, adult: Secondary | ICD-10-CM | POA: Diagnosis not present

## 2021-06-21 ENCOUNTER — Encounter: Payer: Self-pay | Admitting: Nurse Practitioner

## 2021-06-21 ENCOUNTER — Ambulatory Visit: Payer: BLUE CROSS/BLUE SHIELD | Admitting: Nurse Practitioner

## 2021-06-21 ENCOUNTER — Other Ambulatory Visit: Payer: Self-pay

## 2021-06-21 ENCOUNTER — Ambulatory Visit (INDEPENDENT_AMBULATORY_CARE_PROVIDER_SITE_OTHER): Payer: BLUE CROSS/BLUE SHIELD

## 2021-06-21 VITALS — BP 115/73 | HR 97 | Temp 98.1°F | Ht 61.0 in | Wt 214.0 lb

## 2021-06-21 DIAGNOSIS — M25511 Pain in right shoulder: Secondary | ICD-10-CM | POA: Insufficient documentation

## 2021-06-21 DIAGNOSIS — M79641 Pain in right hand: Secondary | ICD-10-CM | POA: Insufficient documentation

## 2021-06-21 MED ORDER — NAPROXEN 500 MG PO TABS: 500.0000 mg | ORAL_TABLET | Freq: Two times a day (BID) | ORAL | 0 refills | Status: DC

## 2021-06-21 NOTE — Patient Instructions (Signed)
Hand Pain Many things can cause hand pain. Some common causes are: An injury. Repeating the same movement with your hand over and over (overuse). Osteoporosis. Arthritis. Lumps in the tendons or joints of the hand and wrist (ganglion cysts). Nerve compression syndromes (carpal tunnel syndrome). Inflammation of the tendons (tendinitis). Infection. Follow these instructions at home: Pay attention to any changes in your symptoms. Take these actions to help withyour discomfort: Managing pain, stiffness, and swelling  Take over-the-counter and prescription medicines only as told by your health care provider. Wear a hand splint or support as told by your health care provider. If directed, put ice on the affected area: Put ice in a plastic bag. Place a towel between your skin and the bag. Leave the ice on for 20 minutes, 2-3 times a day.  Activity Take breaks from repetitive activity often. Avoid activities that make your pain worse. Minimize stress on your hands and wrists as much as possible. Do stretches or exercises as told by your health care provider. Do not do activities that make your pain worse. Contact a health care provider if: Your pain does not get better after a few days of self-care. Your pain gets worse. Your pain affects your ability to do your daily activities. Get help right away if: Your hand becomes warm, red, or swollen. Your hand is numb or tingling. Your hand is extremely swollen or deformed. Your hand or fingers turn white or blue. You cannot move your hand, wrist, or fingers. Summary Many things can cause hand pain. Contact your health care provider if your pain does not get better after a few days of self care. Minimize stress on your hands and wrists as much as possible. Do not do activities that make your pain worse. This information is not intended to replace advice given to you by your health care provider. Make sure you discuss any questions you have  with your healthcare provider. Document Revised: 08/29/2018 Document Reviewed: 08/29/2018 Elsevier Patient Education  2022 [employer] Shoulder Pain Many things can cause shoulder pain, including: An injury. Moving the shoulder in the same way again and again (overuse). Joint pain (arthritis). Pain can come from: Swelling and irritation (inflammation) of any part of the shoulder. An injury to the shoulder joint. An injury to: Tissues that connect muscle to bone (tendons). Tissues that connect bones to each other (ligaments). Bones. Follow these instructions at home: Watch for changes in your symptoms. Let your doctor know about them. Followthese instructions to help with your pain. If you have a sling: Wear the sling as told by your doctor. Remove it only as told by your doctor. Loosen the sling if your fingers: Tingle. Become numb. Turn cold and blue. Keep the sling clean. If the sling is not waterproof: Do not let it get wet. Take the sling off when you shower or bathe. Managing pain, stiffness, and swelling  If told, put ice on the painful area: Put ice in a plastic bag. Place a towel between your skin and the bag. Leave the ice on for 20 minutes, 2-3 times a day. Stop putting ice on if it does not help with the pain. Squeeze a soft ball or a foam pad as much as possible. This prevents swelling in the shoulder. It also helps to strengthen the arm.  General instructions Take over-the-counter and prescription medicines only as told by your doctor. Keep all follow-up visits as told by your doctor. This is important. Contact a doctor if: Your  pain gets worse. Medicine does not help your pain. You have new pain in your arm, hand, or fingers. Get help right away if: Your arm, hand, or fingers: Tingle. Are numb. Are swollen. Are painful. Turn white or blue. Summary Shoulder pain can be caused by many things. These include injury, moving the shoulder in the same away  again and again, and joint pain. Watch for changes in your symptoms. Let your doctor know about them. This condition may be treated with a sling, ice, and pain medicine. Contact your doctor if the pain gets worse or you have new pain. Get help right away if your arm, hand, or fingers tingle or get numb, swollen, or painful. Keep all follow-up visits as told by your doctor. This is important. This information is not intended to replace advice given to you by your health care provider. Make sure you discuss any questions you have with your healthcare provider. Document Revised: 06/17/2018 Document Reviewed: 06/17/2018 Elsevier Patient Education  2022 [employer].
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
## 2021-06-21 NOTE — Progress Notes (Signed)
Acute Office Visit  Subjective:    Patient ID: Natalie Ortiz, female    DOB: [birth year], 40 y.o.   MRN: [id number]  Chief Complaint  Patient presents with   Shoulder Pain   hand numbness    Shoulder Pain  The pain is present in the right shoulder and right hand. This is a recurrent problem. The current episode started more than 1 year ago. There has been no history of extremity trauma (lifting). The problem has been unchanged. The quality of the pain is described as aching. The pain is moderate. Associated symptoms include a limited range of motion. Pertinent negatives include no fever or joint swelling. The symptoms are aggravated by activity. She has tried nothing for the symptoms.  Hand Pain  The incident occurred more than 1 week ago. The incident occurred at home. There was no injury mechanism. The pain is present in the right hand. The quality of the pain is described as aching. The pain does not radiate.    No past medical history on file.  No past surgical history on file.  No family history on file.  Social History   Socioeconomic History   Marital status: Married    Spouse name: Not on file   Number of children: Not on file   Years of education: Not on file   Highest education level: Not on file  Occupational History   Not on file  Tobacco Use   Smoking status: Never   Smokeless tobacco: Never  Substance and Sexual Activity   Alcohol use: Yes    Comment: rare   Drug use: No   Sexual activity: Not on file  Other Topics Concern   Not on file  Social History Narrative   Not on file   Social Determinants of Health   Financial Resource Strain: Not on file  Food Insecurity: Not on file  Transportation Needs: Not on file  Physical Activity: Not on file  Stress: Not on file  Social Connections: Not on file  Intimate Partner Violence: Not on file    Outpatient Medications Prior to Visit  Medication Sig Dispense Refill   acetaminophen (TYLENOL) 500 MG  tablet Take 1,000 mg by mouth every 6 (six) hours as needed.     CAMILA 0.35 MG tablet Take 1 tablet by mouth daily.  5   tolnaftate (TINACTIN) 1 % powder Apply 1 application topically 2 (two) times daily. 45 g 0   No facility-administered medications prior to visit.    No Known Allergies  Review of Systems  Constitutional: Negative.  Negative for fever.  HENT: Negative.    Respiratory: Negative.    Gastrointestinal: Negative.   Skin:  Negative for rash.  Neurological:  Negative for headaches.  Psychiatric/Behavioral: Negative.    All other systems reviewed and are negative.     Objective:    Physical Exam Vitals reviewed.  Constitutional:      Appearance: Normal appearance.  HENT:     Head: Normocephalic.     Nose: Nose normal.  Eyes:     Conjunctiva/sclera: Conjunctivae normal.  Cardiovascular:     Rate and Rhythm: Normal rate and regular rhythm.     Pulses: Normal pulses.     Heart sounds: Normal heart sounds.  Pulmonary:     Effort: Pulmonary effort is normal.     Breath sounds: Normal breath sounds.  Abdominal:     General: Bowel sounds are normal.  Musculoskeletal:     Right shoulder: Tenderness present.  Decreased range of motion.     Right hand: Tenderness present. No swelling or deformity. Normal range of motion.  Skin:    Findings: No rash.  Neurological:     Mental Status: She is alert and oriented to person, place, and time.    BP 115/73   Pulse 97   Temp 98.1 F (36.7 C) (Temporal)   Ht 5\' 1"  (1.549 m)   Wt 214 lb (97.1 kg)   SpO2 97%   BMI 40.43 kg/m  Wt Readings from Last 3 Encounters:  06/21/21 214 lb (97.1 kg)  03/21/20 211 lb (95.7 kg)  03/03/18 210 lb (95.3 kg)    Health Maintenance Due  Topic Date Due   COVID-19 Vaccine (1) Never done   HIV Screening  Never done   Hepatitis C Screening  Never done   TETANUS/TDAP  Never done   PAP SMEAR-Modifier  Never done    There are no preventive care reminders to display for this  patient.       Assessment & Plan:   Problem List Items Addressed This Visit       Other   Acute pain of right shoulder - Primary    Recurrent shoulder pain in the last year and a half.  Patient reports helping.  A coworker from a fall and sprained her shoulder.  Since then pain has radiated down to her right hand and feelings of numbness and tingling occurs intermittently.  Patient has used nothing for symptoms.  Completed right shoulder/right hand x-ray results pending.  Advised patient to use cold/warm compress as tolerated, naproxen 500 mg twice daily as needed,.  Follow-up with worsening or unresolved symptoms Rx sent to pharmacy.         Relevant Medications   naproxen (NAPROSYN) 500 MG tablet   Other Relevant Orders   DG Shoulder Right   DG Hand Complete Right   Right hand pain    Unresolved right hand pain recurrent intermittently in the past few weeks to months.  X-ray of right hand completed results pending anti-inflammatory [naproxen 500 mg tablet by mouth twice daily as needed].  Ice/warm compress as tolerated.  Rx sent to pharmacy. Education provided to patient with printed handouts given.  Follow-up with worsening unresolved symptoms.          Meds ordered this encounter  Medications   naproxen (NAPROSYN) 500 MG tablet    Sig: Take 1 tablet (500 mg total) by mouth 2 (two) times daily with a meal.    Dispense:  30 tablet    Refill:  0    Order Specific Question:   Supervising Provider    Answer:   03/05/18 Raliegh Ip     [[id number]], NP

## 2021-06-21 NOTE — Assessment & Plan Note (Signed)
Unresolved right hand pain recurrent intermittently in the past few weeks to months.  X-ray of right hand completed results pending anti-inflammatory [naproxen 500 mg tablet by mouth twice daily as needed].  Ice/warm compress as tolerated.  Rx sent to pharmacy. Education provided to patient with printed handouts given.  Follow-up with worsening unresolved symptoms.

## 2022-10-25 ENCOUNTER — Encounter: Payer: Self-pay | Admitting: Obstetrics & Gynecology

## 2022-10-25 ENCOUNTER — Ambulatory Visit (INDEPENDENT_AMBULATORY_CARE_PROVIDER_SITE_OTHER): Payer: 59 | Admitting: Obstetrics & Gynecology

## 2022-10-25 VITALS — BP 121/87 | HR 73 | Ht 63.0 in | Wt 215.0 lb

## 2022-10-25 DIAGNOSIS — Z1231 Encounter for screening mammogram for malignant neoplasm of breast: Secondary | ICD-10-CM

## 2022-10-25 MED ORDER — NORETHINDRONE 0.35 MG PO TABS: 1.0000 | ORAL_TABLET | Freq: Every morning | ORAL | 12 refills | Status: DC

## 2022-10-25 NOTE — Progress Notes (Signed)
Subjective:     Natalie Ortiz is a 41 y.o. female here for a routine exam.  No LMP recorded. (Menstrual status: Oral contraceptives). [id number] Birth Control Method:  micronor Menstrual Calendar(currently):oligomenorrheic on micronor  Current complaints: none.   Current acute medical issues:  none   Recent Gynecologic History No LMP recorded. (Menstrual status: Oral contraceptives). Last Pap: 6/22 ASCUS negative HPV-->repeat 3 years,   Last mammogram: na,    History reviewed. No pertinent past medical history.  History reviewed. No pertinent surgical history.  OB History     Gravida  2   Para  1   Term  1   Preterm  0   AB  1   Living  1      SAB      IAB      Ectopic      Multiple      Live Births              Social History   Socioeconomic History   Marital status: Married    Spouse name: Not on file   Number of children: Not on file   Years of education: Not on file   Highest education level: Not on file  Occupational History   Not on file  Tobacco Use   Smoking status: Never   Smokeless tobacco: Never  Vaping Use   Vaping Use: Never used  Substance and Sexual Activity   Alcohol use: Yes    Comment: rare   Drug use: No   Sexual activity: Yes    Birth control/protection: Pill  Other Topics Concern   Not on file  Social History Narrative   Not on file   Social Determinants of Health   Financial Resource Strain: Low Risk  (10/25/2022)   Overall Financial Resource Strain (CARDIA)    Difficulty of Paying Living Expenses: Not hard at all  Food Insecurity: No Food Insecurity (10/25/2022)   Hunger Vital Sign    Worried About Running Out of Food in the Last Year: Never true    [place] in the Last Year: Never true  Transportation Needs: No Transportation Needs (10/25/2022)   PRAPARE - Hydrologist (Medical): No    Lack of Transportation (Non-Medical): No  Physical Activity: Inactive (10/25/2022)    Exercise Vital Sign    Days of Exercise per Week: 0 days    Minutes of Exercise per Session: 0 min  Stress: No Stress Concern Present (10/25/2022)   [place]    Feeling of Stress : Not at all  Social Connections: Moderately Integrated (10/25/2022)   Social Connection and Isolation Panel [NHANES]    Frequency of Communication with Friends and Family: More than three times a week    Frequency of Social Gatherings with Friends and Family: Once a week    Attends Religious Services: More than 4 times per year    Active Member of [employer] or Organizations: No    Attends Archivist Meetings: Never    Marital Status: Married    History reviewed. No pertinent family history.   Current Outpatient Medications:    acetaminophen (TYLENOL) 500 MG tablet, Take 1,000 mg by mouth every 6 (six) hours as needed. (Patient not taking: Reported on 10/25/2022), Disp: , Rfl:    norethindrone (MICRONOR) 0.35 MG tablet, Take 1 tablet (0.35 mg total) by mouth every morning., Disp: 28 tablet, Rfl: 12  Review of  Systems  Review of Systems  Constitutional: Negative for fever, chills, weight loss, malaise/fatigue and diaphoresis.  HENT: Negative for hearing loss, ear pain, nosebleeds, congestion, sore throat, neck pain, tinnitus and ear discharge.   Eyes: Negative for blurred vision, double vision, photophobia, pain, discharge and redness.  Respiratory: Negative for cough, hemoptysis, sputum production, shortness of breath, wheezing and stridor.   Cardiovascular: Negative for chest pain, palpitations, orthopnea, claudication, leg swelling and PND.  Gastrointestinal: negative for abdominal pain. Negative for heartburn, nausea, vomiting, diarrhea, constipation, blood in stool and melena.  Genitourinary: Negative for dysuria, urgency, frequency, hematuria and flank pain.  Musculoskeletal: Negative for myalgias, back pain, joint pain and falls.   Skin: Negative for itching and rash.  Neurological: Negative for dizziness, tingling, tremors, sensory change, speech change, focal weakness, seizures, loss of consciousness, weakness and headaches.  Endo/Heme/Allergies: Negative for environmental allergies and polydipsia. Does not bruise/bleed easily.  Psychiatric/Behavioral: Negative for depression, suicidal ideas, hallucinations, memory loss and substance abuse. The patient is not nervous/anxious and does not have insomnia.        Objective:  Blood pressure 121/87, pulse 73, height 5\' 3"  (1.6 m), weight 215 lb (97.5 kg).   Physical Exam  Vitals reviewed. Constitutional: She is oriented to person, place, and time. She appears well-developed and well-nourished.  HENT:  Head: Normocephalic and atraumatic.        Right Ear: External ear normal.  Left Ear: External ear normal.  Nose: Nose normal.  Mouth/Throat: Oropharynx is clear and moist.  Eyes: Conjunctivae and EOM are normal. Pupils are equal, round, and reactive to light. Right eye exhibits no discharge. Left eye exhibits no discharge. No scleral icterus.  Neck: Normal range of motion. Neck supple. No tracheal deviation present. No thyromegaly present.  Cardiovascular: Normal rate, regular rhythm, normal heart sounds and intact distal pulses.  Exam reveals no gallop and no friction rub.   No murmur heard. Respiratory: Effort normal and breath sounds normal. No respiratory distress. She has no wheezes. She has no rales. She exhibits no tenderness.  GI: Soft. Bowel sounds are normal. She exhibits no distension and no mass. There is no tenderness. There is no rebound and no guarding.  Genitourinary:  Breasts no masses skin changes or nipple changes bilaterally      Vulva is normal without lesions Vagina is pink moist without discharge Cervix normal in appearance and pap is not done Uterus is normal size shape and contour Adnexa is negative with normal sized ovaries    Musculoskeletal: Normal range of motion. She exhibits no edema and no tenderness.  Neurological: She is alert and oriented to person, place, and time. She has normal reflexes. She displays normal reflexes. No cranial nerve deficit. She exhibits normal muscle tone. Coordination normal.  Skin: Skin is warm and dry. No rash noted. No erythema. No pallor.  Psychiatric: She has a normal mood and affect. Her behavior is normal. Judgment and thought content normal.       Medications Ordered at today's visit: Meds ordered this encounter  Medications   norethindrone (MICRONOR) 0.35 MG tablet    Sig: Take 1 tablet (0.35 mg total) by mouth every morning.    Dispense:  28 tablet    Refill:  12    Other orders placed at today's visit: Orders Placed This Encounter  Procedures   MM 3D SCREEN BREAST BILATERAL      Assessment:    Normal Gyn exam.    Plan:    Contraception: oral  progesterone-only contraceptive. Mammogram ordered. Follow up in: 18 months.     Return in about 18 months (around 04/24/2024).

## 2022-11-07 ENCOUNTER — Ambulatory Visit: Payer: BLUE CROSS/BLUE SHIELD | Admitting: Family Medicine

## 2023-07-23 ENCOUNTER — Other Ambulatory Visit (HOSPITAL_COMMUNITY): Payer: Self-pay | Admitting: Obstetrics & Gynecology

## 2023-07-23 DIAGNOSIS — Z1231 Encounter for screening mammogram for malignant neoplasm of breast: Secondary | ICD-10-CM

## 2023-07-26 ENCOUNTER — Ambulatory Visit (INDEPENDENT_AMBULATORY_CARE_PROVIDER_SITE_OTHER): Payer: 59 | Admitting: Family Medicine

## 2023-07-26 ENCOUNTER — Encounter: Payer: Self-pay | Admitting: Family Medicine

## 2023-07-26 VITALS — BP 120/81 | HR 78 | Ht 63.0 in | Wt 208.0 lb

## 2023-07-26 DIAGNOSIS — Z0000 Encounter for general adult medical examination without abnormal findings: Secondary | ICD-10-CM

## 2023-07-26 DIAGNOSIS — Z131 Encounter for screening for diabetes mellitus: Secondary | ICD-10-CM

## 2023-07-26 DIAGNOSIS — Z23 Encounter for immunization: Secondary | ICD-10-CM

## 2023-07-26 DIAGNOSIS — Z0001 Encounter for general adult medical examination with abnormal findings: Secondary | ICD-10-CM

## 2023-07-26 DIAGNOSIS — Z114 Encounter for screening for human immunodeficiency virus [HIV]: Secondary | ICD-10-CM

## 2023-07-26 DIAGNOSIS — Z1159 Encounter for screening for other viral diseases: Secondary | ICD-10-CM

## 2023-07-26 DIAGNOSIS — Z13220 Encounter for screening for lipoid disorders: Secondary | ICD-10-CM

## 2023-07-26 NOTE — Progress Notes (Signed)
BP 120/81   Pulse 78   Ht 5\' 3"  (1.6 m)   Wt 208 lb (94.3 kg)   SpO2 97%   BMI 36.85 kg/m    Subjective:   Patient ID: Natalie Ortiz, female    DOB: September 17, 1981, 42 y.o.   MRN: [id number]  HPI: Natalie Ortiz is a 42 y.o. female presenting on 07/26/2023 for Medical Management of Chronic Issues (CPE-no pap, [facility]. Pap due next year)   HPI Physical exam Patient denies any chest pain, shortness of breath, headaches or vision issues, abdominal complaints, diarrhea, nausea, vomiting, or joint issues.  Patient is coming in for physical exam she gets her Pap smears elsewhere.  She says her only real issue that she has she has a little bit of wax in difficulty hearing at times in her right ear.  She has not tried any over-the-counter drops at this point.  Another issue that she complains about is that she will get occasional numbness on the back of her right hand sometimes at night but not all of the time and says it has been off and on but she says if she has her shoulder over her head it does make it worse or if she lays on that side on that shoulder it makes it worse sometimes.  She has had some shoulder issues in the past and does not know if it is related to that.  She says is not all the time and she denies any weakness.  Her only other symptom that she gets is occasionally some lower bowel discomfort or pressure especially after intercourse or lifting something.  She does states she is constipated some of the time and has to strain some of the time although she has bowel movements most days.  Relevant past medical, surgical, family and social history reviewed and updated as indicated. Interim medical history since our last visit reviewed. Allergies and medications reviewed and updated.  Review of Systems  Constitutional:  Negative for chills and fever.  HENT:  Positive for hearing loss. Negative for congestion, ear discharge, ear pain and tinnitus.   Eyes:  Negative for  pain, redness and visual disturbance.  Respiratory:  Negative for cough, chest tightness, shortness of breath and wheezing.   Cardiovascular:  Negative for chest pain, palpitations and leg swelling.  Gastrointestinal:  Positive for abdominal distention and constipation. Negative for abdominal pain, blood in stool, diarrhea, nausea and vomiting.  Genitourinary:  Negative for difficulty urinating, dysuria and hematuria.  Musculoskeletal:  Negative for back pain, gait problem and myalgias.  Skin:  Negative for rash.  Neurological:  Negative for dizziness, weakness, light-headedness and headaches.  Psychiatric/Behavioral:  Negative for agitation, behavioral problems and suicidal ideas.   All other systems reviewed and are negative.   Per HPI unless specifically indicated above   Allergies as of 07/26/2023   No Known Allergies      Medication List        Accurate as of July 26, 2023 11:42 AM. If you have any questions, ask your nurse or doctor.          acetaminophen 500 MG tablet Commonly known as: TYLENOL Take 1,000 mg by mouth every 6 (six) hours as needed.   norethindrone 0.35 MG tablet Commonly known as: MICRONOR Take 1 tablet (0.35 mg total) by mouth every morning.         Objective:   BP 120/81   Pulse 78   Ht 5\' 3"  (1.6 m)  Wt 208 lb (94.3 kg)   SpO2 97%   BMI 36.85 kg/m   Wt Readings from Last 3 Encounters:  07/26/23 208 lb (94.3 kg)  10/25/22 215 lb (97.5 kg)  06/21/21 214 lb (97.1 kg)    Physical Exam Vitals and nursing note reviewed.  Constitutional:      General: She is not in acute distress.    Appearance: She is well-developed. She is not diaphoretic.  HENT:     Right Ear: There is impacted cerumen.     Left Ear: Tympanic membrane normal.     Mouth/Throat:     Mouth: Mucous membranes are moist.     Pharynx: Oropharynx is clear. No oropharyngeal exudate or posterior oropharyngeal erythema.  Eyes:     Conjunctiva/sclera: Conjunctivae  normal.  Cardiovascular:     Rate and Rhythm: Normal rate and regular rhythm.     Heart sounds: Normal heart sounds. No murmur heard. Pulmonary:     Effort: Pulmonary effort is normal. No respiratory distress.     Breath sounds: Normal breath sounds. No wheezing.  Abdominal:     General: Abdomen is flat. Bowel sounds are normal. There is no distension.     Palpations: Abdomen is soft.     Tenderness: There is no abdominal tenderness. There is no right CVA tenderness, left CVA tenderness, guarding or rebound.  Musculoskeletal:        General: No swelling, tenderness, deformity or signs of injury. Normal range of motion.     Right wrist: No swelling, tenderness, bony tenderness, snuff box tenderness or crepitus.     Right hand: Normal. Normal strength. Normal sensation.     Left hand: Normal. Normal strength. Normal sensation.     Comments: Carpal tunnel testing was negative  Skin:    General: Skin is warm and dry.     Findings: No rash.  Neurological:     Mental Status: She is alert and oriented to person, place, and time.     Coordination: Coordination normal.  Psychiatric:        Behavior: Behavior normal.       Assessment & Plan:   Problem List Items Addressed This Visit   None Visit Diagnoses     Physical exam    -  Primary   Relevant Orders   Tdap vaccine greater than or equal to 7yo IM (Completed)   CBC with Differential/Platelet   CMP14+EGFR   Lipid panel   HIV antibody (with reflex)   Hepatitis C antibody   Need for Tdap vaccination       Relevant Orders   Tdap vaccine greater than or equal to 7yo IM (Completed)   Encounter for screening for HIV       Relevant Orders   HIV antibody (with reflex)   Need for hepatitis C screening test       Relevant Orders   Hepatitis C antibody   Diabetes mellitus screening       Relevant Orders   CBC with Differential/Platelet   CMP14+EGFR   Lipid screening       Relevant Orders   CBC with Differential/Platelet    CMP14+EGFR   Lipid panel       For her shoulder issue, it sounds like nerve impingement coming from the shoulder that is intermittent and recommended that she take an anti-inflammatory for a month and see if that improves it, if not go see an orthopedic about the shoulder.  Cerumen lavage by nurse.  Patient tolerated  well.  For her constipation and lower bowel and lower abdominal pressure recommended that she do MiraLAX for a week and clean things out and then see if that resets things. Follow up plan: Return in about 1 year (around 07/25/2024), or if symptoms worsen or fail to improve, for Physical exam.  Counseling provided for all of the vaccine components Orders Placed This Encounter  Procedures   Tdap vaccine greater than or equal to 7yo IM   CBC with Differential/Platelet   CMP14+EGFR   Lipid panel   HIV antibody (with reflex)   Hepatitis C antibody    Arville Care, MD [facility] 07/26/2023, 11:42 AM

## 2023-07-26 NOTE — Patient Instructions (Signed)
Using MiraLAX for 1 week for constipation, drink plenty of water with this do not get dehydrated.  Use Debrox over-the-counter eardrops for the right ear.  Do not stick anything in your ear smaller than your elbow.  Try taking ibuprofen or Advil for 1 month every day with food to see if that improves the shoulder and the numbness.

## 2023-08-05 ENCOUNTER — Ambulatory Visit (HOSPITAL_COMMUNITY)
Admission: RE | Admit: 2023-08-05 | Discharge: 2023-08-05 | Disposition: A | Discharge status: Home / Self Care | Payer: 59 | Source: Ambulatory Visit | Attending: Obstetrics & Gynecology | Admitting: Obstetrics & Gynecology

## 2023-08-05 ENCOUNTER — Encounter (HOSPITAL_COMMUNITY): Payer: Self-pay

## 2023-08-05 DIAGNOSIS — Z1231 Encounter for screening mammogram for malignant neoplasm of breast: Secondary | ICD-10-CM | POA: Diagnosis not present

## 2023-10-05 ENCOUNTER — Other Ambulatory Visit: Payer: Self-pay | Admitting: Obstetrics & Gynecology

## 2023-10-09 ENCOUNTER — Telehealth: Payer: Self-pay | Admitting: Obstetrics & Gynecology

## 2023-10-09 NOTE — Telephone Encounter (Signed)
Requesting refill for Micronor.  Appt in a few weeks but will be out of prescription before then.

## 2023-10-09 NOTE — Telephone Encounter (Signed)
Patient called to checking on birth control refill status. She has an appointment on 11/06/23 and will run out before than. Please advise.

## 2023-11-06 ENCOUNTER — Other Ambulatory Visit (HOSPITAL_COMMUNITY)
Admission: RE | Admit: 2023-11-06 | Discharge: 2023-11-06 | Disposition: A | Discharge status: Home / Self Care | Payer: 59 | Source: Ambulatory Visit | Attending: Obstetrics & Gynecology | Admitting: Obstetrics & Gynecology

## 2023-11-06 ENCOUNTER — Ambulatory Visit: Payer: 59 | Admitting: Obstetrics & Gynecology

## 2023-11-06 ENCOUNTER — Encounter: Payer: Self-pay | Admitting: Obstetrics & Gynecology

## 2023-11-06 VITALS — BP 122/87 | HR 84 | Ht 64.0 in | Wt 212.0 lb

## 2023-11-06 DIAGNOSIS — Z01419 Encounter for gynecological examination (general) (routine) without abnormal findings: Secondary | ICD-10-CM | POA: Insufficient documentation

## 2023-11-06 MED ORDER — NORETHINDRONE 0.35 MG PO TABS: 1.0000 | ORAL_TABLET | Freq: Every morning | ORAL | 4 refills | Status: DC

## 2023-11-06 NOTE — Progress Notes (Signed)
Subjective:     Natalie Ortiz is a 42 y.o. female here for a routine exam.  Patient's last menstrual period was 10/11/2023 (exact date). [id number] Birth Control Method:  micronor POP Menstrual Calendar(currently): every 3-4 cycles  Current complaints: none.   Current acute medical issues:  none   Recent Gynecologic History Patient's last menstrual period was 10/11/2023 (exact date). Last Pap: na,  normal Last mammogram: 07/2023,  normal  History reviewed. No pertinent past medical history.  History reviewed. No pertinent surgical history.  OB History     Gravida  2   Para  1   Term  1   Preterm  0   AB  1   Living  1      SAB      IAB      Ectopic      Multiple      Live Births              Social History   Socioeconomic History   Marital status: Married    Spouse name: Not on file   Number of children: 1   Years of education: Not on file   Highest education level: Not on file  Occupational History   Not on file  Tobacco Use   Smoking status: Never   Smokeless tobacco: Never  Vaping Use   Vaping status: Never Used  Substance and Sexual Activity   Alcohol use: Not Currently    Comment: rare   Drug use: No   Sexual activity: Yes    Birth control/protection: Pill  Other Topics Concern   Not on file  Social History Narrative   Not on file   Social Determinants of Health   Financial Resource Strain: Low Risk  (11/06/2023)   Overall Financial Resource Strain (CARDIA)    Difficulty of Paying Living Expenses: Not very hard  Food Insecurity: No Food Insecurity (11/06/2023)   Hunger Vital Sign    Worried About Running Out of Food in the Last Year: Never true    Ran Out of Food in the Last Year: Never true  Transportation Needs: No Transportation Needs (11/06/2023)   PRAPARE - Administrator, Civil Service (Medical): No    Lack of Transportation (Non-Medical): No  Physical Activity: Insufficiently Active (11/06/2023)   Exercise  Vital Sign    Days of Exercise per Week: 2 days    Minutes of Exercise per Session: 20 min  Stress: No Stress Concern Present (11/06/2023)   [employer] of Occupational Health - Occupational Stress Questionnaire    Feeling of Stress : Not at all  Social Connections: Moderately Integrated (11/06/2023)   Social Connection and Isolation Panel [NHANES]    Frequency of Communication with Friends and Family: Three times a week    Frequency of Social Gatherings with Friends and Family: Once a week    Attends Religious Services: 1 to 4 times per year    Active Member of [employer] or Organizations: No    Attends Engineer, structural: Never    Marital Status: Married    Family History  Problem Relation Age of Onset   Cirrhosis Father    Diabetes Mother    Hypertension Mother      Current Outpatient Medications:    acetaminophen (TYLENOL) 500 MG tablet, Take 1,000 mg by mouth every 6 (six) hours as needed. (Patient not taking: Reported on 11/06/2023), Disp: , Rfl:    norethindrone (MICRONOR) 0.35 MG tablet, Take 1 tablet (  0.35 mg total) by mouth every morning., Disp: 84 tablet, Rfl: 4  Review of Systems  Review of Systems  Constitutional: Negative for fever, chills, weight loss, malaise/fatigue and diaphoresis.  HENT: Negative for hearing loss, ear pain, nosebleeds, congestion, sore throat, neck pain, tinnitus and ear discharge.   Eyes: Negative for blurred vision, double vision, photophobia, pain, discharge and redness.  Respiratory: Negative for cough, hemoptysis, sputum production, shortness of breath, wheezing and stridor.   Cardiovascular: Negative for chest pain, palpitations, orthopnea, claudication, leg swelling and PND.  Gastrointestinal: negative for abdominal pain. Negative for heartburn, nausea, vomiting, diarrhea, constipation, blood in stool and melena.  Genitourinary: Negative for dysuria, urgency, frequency, hematuria and flank pain.  Musculoskeletal: Negative  for myalgias, back pain, joint pain and falls.  Skin: Negative for itching and rash.  Neurological: Negative for dizziness, tingling, tremors, sensory change, speech change, focal weakness, seizures, loss of consciousness, weakness and headaches.  Endo/Heme/Allergies: Negative for environmental allergies and polydipsia. Does not bruise/bleed easily.  Psychiatric/Behavioral: Negative for depression, suicidal ideas, hallucinations, memory loss and substance abuse. The patient is not nervous/anxious and does not have insomnia.        Objective:  Blood pressure 122/87, pulse 84, height 5\' 4"  (1.626 m), weight 212 lb (96.2 kg), last menstrual period 10/11/2023.   Physical Exam  Vitals reviewed. Constitutional: She is oriented to person, place, and time. She appears well-developed and well-nourished.  HENT:  Head: Normocephalic and atraumatic.        Right Ear: External ear normal.  Left Ear: External ear normal.  Nose: Nose normal.  Mouth/Throat: Oropharynx is clear and moist.  Eyes: Conjunctivae and EOM are normal. Pupils are equal, round, and reactive to light. Right eye exhibits no discharge. Left eye exhibits no discharge. No scleral icterus.  Neck: Normal range of motion. Neck supple. No tracheal deviation present. No thyromegaly present.  Cardiovascular: Normal rate, regular rhythm, normal heart sounds and intact distal pulses.  Exam reveals no gallop and no friction rub.   No murmur heard. Respiratory: Effort normal and breath sounds normal. No respiratory distress. She has no wheezes. She has no rales. She exhibits no tenderness.  GI: Soft. Bowel sounds are normal. She exhibits no distension and no mass. There is no tenderness. There is no rebound and no guarding.  Genitourinary:  Breasts no masses skin changes or nipple changes bilaterally      Vulva is normal without lesions Vagina is pink moist without discharge Cervix normal in appearance and pap is done Uterus is normal size  shape and contour Adnexa is negative with normal sized ovaries   Musculoskeletal: Normal range of motion. She exhibits no edema and no tenderness.  Neurological: She is alert and oriented to person, place, and time. She has normal reflexes. She displays normal reflexes. No cranial nerve deficit. She exhibits normal muscle tone. Coordination normal.  Skin: Skin is warm and dry. No rash noted. No erythema. No pallor.  Psychiatric: She has a normal mood and affect. Her behavior is normal. Judgment and thought content normal.       Medications Ordered at today's visit: Meds ordered this encounter  Medications   norethindrone (MICRONOR) 0.35 MG tablet    Sig: Take 1 tablet (0.35 mg total) by mouth every morning.    Dispense:  84 tablet    Refill:  4    Other orders placed at today's visit: No orders of the defined types were placed in this encounter.    ASSESSMENT +  PLAN:    ICD-10-CM   1. Well woman exam with routine gynecological exam  Z01.419    continue micronor for [facility]          Return in about 3 years (around 11/05/2026), or if symptoms worsen or fail to improve.

## 2023-11-06 NOTE — Addendum Note (Signed)
Addended by: Freddie Apley R on: 11/06/2023 12:25 PM   Modules accepted: Orders

## 2023-11-11 LAB — CYTOLOGY - PAP
Adequacy: ABSENT
Comment: NEGATIVE
Comment: NEGATIVE
Comment: NEGATIVE
Diagnosis: NEGATIVE
HPV 16: NEGATIVE
HPV 18 / 45: NEGATIVE
High risk HPV: POSITIVE — AB

## 2023-11-21 ENCOUNTER — Telehealth: Payer: Self-pay

## 2023-11-21 NOTE — Telephone Encounter (Signed)
Patient would like for someone to call her about her pap results

## 2023-11-29 ENCOUNTER — Telehealth: Payer: Self-pay

## 2023-11-29 NOTE — Telephone Encounter (Signed)
Patient would like for someone to call her about her test results 

## 2023-12-02 ENCOUNTER — Encounter: Payer: Self-pay | Admitting: Obstetrics & Gynecology

## 2024-07-27 ENCOUNTER — Encounter: Payer: 59 | Admitting: Family Medicine

## 2024-08-27 ENCOUNTER — Encounter: Payer: Self-pay | Admitting: Family Medicine

## 2024-08-27 ENCOUNTER — Ambulatory Visit (INDEPENDENT_AMBULATORY_CARE_PROVIDER_SITE_OTHER): Admitting: Family Medicine

## 2024-08-27 VITALS — BP 130/91 | HR 72 | Ht 64.0 in | Wt 211.0 lb

## 2024-08-27 DIAGNOSIS — Z0000 Encounter for general adult medical examination without abnormal findings: Secondary | ICD-10-CM | POA: Diagnosis not present

## 2024-08-27 NOTE — Progress Notes (Signed)
 BP (!) 130/91   Pulse 72   Ht 5' 4 (1.626 m)   Wt 211 lb (95.7 kg)   SpO2 98%   BMI 36.22 kg/m    Subjective:   Patient ID: Natalie Ortiz, female    DOB: February 11, 1981, 43 y.o.   MRN: [id number]  HPI: Natalie Ortiz is a 43 y.o. female presenting on 08/27/2024 for Medical Management of Chronic Issues (CPE, no pap) and wrist numbness (right)   Discussed the use of AI scribe software for clinical note transcription with the patient, who gave verbal consent to proceed.  History of Present Illness   Natalie Ortiz is a 43 year old female who presents for an annual physical exam.  She recently had a cervical cancer screening on November 06, 2023, which showed abnormal HPV. She is scheduled to repeat the screening with her gynecologist.  She has been experiencing numbness in her right hand for approximately a year, primarily affecting her first three fingers and sometimes presenting as a burning sensation. The numbness occurs more frequently when she sleeps with her arm elevated or holds objects for extended periods. 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  She reports frequent urination, which has been ongoing for a long time. There is no urinary leakage or dribbling, even when coughing or sneezing.  She is currently taking birth control consistently without missing any doses.          Relevant past medical, surgical, family and social history reviewed and updated as indicated. Interim medical history since our last visit reviewed. Allergies and medications reviewed and updated.  Review of Systems  Constitutional:  Negative for chills and fever.  HENT:  Negative for congestion, ear discharge and ear pain.   Eyes:  Negative for redness and visual disturbance.  Respiratory:  Negative for chest tightness and shortness of breath.   Cardiovascular:  Negative for chest pain and leg swelling.  Genitourinary:  Positive for frequency. Negative for difficulty urinating and dysuria.   Musculoskeletal:  Negative for back pain and gait problem.  Skin:  Negative for rash.  Neurological:  Positive for numbness. Negative for dizziness, light-headedness and headaches.  Psychiatric/Behavioral:  Negative for agitation and behavioral problems.   All other systems reviewed and are negative.   Per HPI unless specifically indicated above   Allergies as of 08/27/2024   No Known Allergies      Medication List        Accurate as of August 27, 2024  1:43 PM. If you have any questions, ask your nurse or doctor.          STOP taking these medications    acetaminophen  500 MG tablet Commonly known as: TYLENOL  Stopped by: Natalie LABOR [facility]       TAKE these medications    norethindrone  0.35 MG tablet Commonly known as: MICRONOR  Take 1 tablet (0.35 mg total) by mouth every morning.         Objective:   BP (!) 130/91   Pulse 72   Ht 5' 4 (1.626 m)   Wt 211 lb (95.7 kg)   SpO2 98%   BMI 36.22 kg/m   Wt Readings from Last 3 Encounters:  08/27/24 211 lb (95.7 kg)  11/06/23 212 lb (96.2 kg)  07/26/23 208 lb (94.3 kg)    Physical Exam Vitals and nursing note reviewed.  Musculoskeletal:     Right wrist: No tenderness, bony tenderness, snuff box tenderness or crepitus.     Left wrist: No  tenderness, bony tenderness or crepitus.     Comments: Negative carpal tunnel testing on exam    Physical Exam   VITALS: BP- 130/91 NECK: Thyroid normal, no thyromegaly. No cervical lymphadenopathy. Trachea midline. CHEST: Lungs clear to auscultation bilaterally. CARDIOVASCULAR: Heart regular rate and rhythm, no murmurs. EXTREMITIES: No edema in lower extremities.         Assessment & Plan:   Problem List Items Addressed This Visit   None Visit Diagnoses       Physical exam    -  Primary   Relevant Orders   CBC with Differential/Platelet   CMP14+EGFR   Lipid panel   VITAMIN D  25 Hydroxy (Vit-D Deficiency, Fractures)          Elevated blood  pressure Diastolic pressure at 91 mmHg is concerning. - Monitor blood pressure at home more frequently over the next month. - Consider antihypertensive medication if diastolic pressure remains elevated.  Carpal tunnel syndrome, right upper limb Symptoms consistent with carpal tunnel syndrome, mild severity. Inflammation causes nerve compression. - Advise regular icing of the wrist to reduce inflammation. - Recommend avoiding resting the wrist on the gear shift while driving. - Suggest using a carpal tunnel brace at night to prevent wrist curling. - Consider corticosteroid injection if symptoms worsen or become more bothersome.  Urinary frequency Frequent urination without incontinence, possibly related to childbirth. - Recommend regular Kegel exercises to strengthen pelvic floor muscles. - Discuss the option of using Kegel weights for additional strengthening. - Consider referral to pelvic floor rehabilitation if needed.  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 - Ensure follow-up Pap smear is completed with gynecologist. - Request a copy of the Pap smear results for records.  Adult wellness visit Blood pressure slightly elevated at 130/91 mmHg. - Perform blood work to check sugars, cholesterol, blood counts, kidney, and liver function. - Encourage regular blood pressure monitoring at home.          Follow up plan: Return in about 1 year (around 08/27/2025), or if symptoms worsen or fail to improve, for Physical exam.  Counseling provided for all of the vaccine components Orders Placed This Encounter  Procedures   CBC with Differential/Platelet   CMP14+EGFR   Lipid panel   VITAMIN D  25 Hydroxy (Vit-D Deficiency, Fractures)    Natalie Levins, MD [facility] 08/27/2024, 1:43 PM

## 2024-08-28 LAB — CMP14+EGFR
ALT: 18 IU/L (ref 0–32)
AST: 17 IU/L (ref 0–40)
Albumin: 4.2 g/dL (ref 3.9–4.9)
Alkaline Phosphatase: 65 IU/L (ref 44–121)
BUN/Creatinine Ratio: 15 (ref 9–23)
BUN: 17 mg/dL (ref 6–24)
Bilirubin Total: 0.4 mg/dL (ref 0.0–1.2)
CO2: 22 mmol/L (ref 20–29)
Calcium: 10.1 mg/dL (ref 8.7–10.2)
Chloride: 104 mmol/L (ref 96–106)
Creatinine, Ser: 1.1 mg/dL — ABNORMAL HIGH (ref 0.57–1.00)
Globulin, Total: 3.1 g/dL (ref 1.5–4.5)
Glucose: 76 mg/dL (ref 70–99)
Potassium: 4.4 mmol/L (ref 3.5–5.2)
Sodium: 139 mmol/L (ref 134–144)
Total Protein: 7.3 g/dL (ref 6.0–8.5)
eGFR: 65 mL/min/1.73 (ref >59)

## 2024-08-28 LAB — CBC WITH DIFFERENTIAL/PLATELET
Basophils Absolute: 0.1 x10E3/uL (ref 0.0–0.2)
Basos: 1 %
EOS (ABSOLUTE): 0.2 x10E3/uL (ref 0.0–0.4)
Eos: 2 %
Hematocrit: 43.8 % (ref 34.0–46.6)
Hemoglobin: 14.1 g/dL (ref 11.1–15.9)
Immature Grans (Abs): 0 x10E3/uL (ref 0.0–0.1)
Immature Granulocytes: 0 %
Lymphocytes Absolute: 2.5 x10E3/uL (ref 0.7–3.1)
Lymphs: 34 %
MCH: 29.1 pg (ref 26.6–33.0)
MCHC: 32.2 g/dL (ref 31.5–35.7)
MCV: 91 fL (ref 79–97)
Monocytes Absolute: 0.6 x10E3/uL (ref 0.1–0.9)
Monocytes: 8 %
Neutrophils Absolute: 4.2 x10E3/uL (ref 1.4–7.0)
Neutrophils: 55 %
Platelets: 301 x10E3/uL (ref 150–450)
RBC: 4.84 x10E6/uL (ref 3.77–5.28)
RDW: 12.6 % (ref 11.7–15.4)
WBC: 7.5 x10E3/uL (ref 3.4–10.8)

## 2024-08-28 LAB — LIPID PANEL
Chol/HDL Ratio: 2.5 ratio (ref 0.0–4.4)
Cholesterol, Total: 151 mg/dL (ref 100–199)
HDL: 61 mg/dL (ref >39)
LDL Chol Calc (NIH): 80 mg/dL (ref 0–99)
Triglycerides: 45 mg/dL (ref 0–149)
VLDL Cholesterol Cal: 10 mg/dL (ref 5–40)

## 2024-08-28 LAB — VITAMIN D 25 HYDROXY (VIT D DEFICIENCY, FRACTURES): Vit D, 25-Hydroxy: 16 ng/mL — ABNORMAL LOW (ref 30.0–100.0)

## 2024-08-31 ENCOUNTER — Ambulatory Visit: Payer: Self-pay | Admitting: Family Medicine

## 2024-08-31 ENCOUNTER — Telehealth: Payer: Self-pay

## 2024-08-31 NOTE — Telephone Encounter (Signed)
 Copied from CRM #[id number]. Topic: Clinical - Lab/Test Results >> Aug 31, 2024 11:02 AM Diannia H wrote: Reason for CRM: Patient was calling because she had some additional questions about her Vitamin D  and labs. Could you assist? Patients callback number is [phone].

## 2024-08-31 NOTE — Telephone Encounter (Signed)
 Dr. Maryanne, please review results.

## 2024-10-14 ENCOUNTER — Ambulatory Visit

## 2024-10-14 ENCOUNTER — Ambulatory Visit: Payer: Self-pay

## 2024-10-14 NOTE — Telephone Encounter (Signed)
 Pt has appt

## 2024-10-14 NOTE — Telephone Encounter (Signed)
 FYI Only or Action Required?: FYI only for provider: appointment scheduled on 10/14/2024 .  Patient was last seen in primary care on 08/27/2024 by Dettinger, Fonda LABOR, MD.  Called Nurse Triage reporting Headache and Hypertension.  Symptoms began 3 days ago.  Interventions attempted: OTC medications: sinus medication this morning and Rest, hydration, or home remedies.  Symptoms are: unchanged.  Triage Disposition: See PCP When Office is Open (Within 3 Days)  Patient/caregiver understands and will follow disposition?: Yes             Copied from CRM [id number]. Topic: Clinical - Red Word Triage >> Oct 14, 2024  1:29 PM Willma SAUNDERS wrote: Red Word that prompted transfer to Nurse Triage: Patient has had a headache since Sunday. BP is 151/97. Went down after she sat down but is concerned since her head has also been hurting. Reason for Disposition  [1] MILD-MODERATE headache AND [2] present > 3 days (72 hours) AND [3] no improvement after using Care Advice  Answer Assessment - Initial Assessment Questions 1. BLOOD PRESSURE: What is your blood pressure? Did you take at least two measurements 5 minutes apart?     151/97   12:10 pulse 76 141/97 12:12 pulse 74 140/96  pulse 75  136/91 pulse 91 at 12:23pm 12:26 133/86 pulse 95 12:33 123/85 pulse 92 12:34 138/91 pulse 99 12:35 117/82 pulse 96 12:37 127/87 pulse 99       3. HOW: How did you take your blood pressure? (e.g., automatic home BP monitor, visiting nurse)     Home bp cuff on upper arm 4. HISTORY: Do you have a history of high blood pressure?     No 5. MEDICINES: Are you taking any medicines for blood pressure? Have you missed any doses recently?     No 6. OTHER SYMPTOMS: Do you have any symptoms? (e.g., blurred vision, chest pain, difficulty breathing, headache, weakness)     headache 7. PREGNANCY: Is there any chance you are pregnant? When was your last menstrual period?     No  Answer Assessment  - Initial Assessment Questions Headache for 3 days off and on Denies any injuries Patient was advised to bring her home bp cuff to her appointment if she can to make sure the numbers are what are obtained in the office as well Patient is advised to call us back if anything changes or with any further questions/concerns. Patient is advised that if anything worsens to go to the Emergency Room. Patient verbalized understanding.     1. BLOOD PRESSURE: What is your blood pressure? Did you take at least two measurements 5 minutes apart?     15 1/97   12:10 pulse 76 141/97 12:12 pulse 74 140/96  pulse 75  136/91 pulse 91 at 12:23pm 12:26 133/86 pulse 95 12:33 123/85 pulse 92 12:34 138/91 pulse 99 12:35 117/82 pulse 96 12:37 127/87 pulse 99       3. HOW: How did you take your blood pressure? (e.g., automatic home BP monitor, visiting nurse)     Home bp cuff on upper arm 4. HISTORY: Do you have a history of high blood pressure?     No 5. MEDICINES: Are you taking any medicines for blood pressure? Have you missed any doses recently?     No 6. OTHER SYMPTOMS: Do you have any symptoms? (e.g., blurred vision, chest pain, difficulty breathing, headache, weakness)     headache 7. PREGNANCY: Is there any chance you are pregnant? When was your last menstrual  period?     No  Protocols used: Blood Pressure - High-A-AH, Headache-A-AH

## 2024-10-15 ENCOUNTER — Ambulatory Visit: Admitting: Family Medicine

## 2024-10-15 ENCOUNTER — Encounter: Payer: Self-pay | Admitting: Family Medicine

## 2024-10-15 VITALS — BP 128/81 | HR 79 | Temp 98.1°F | Ht 64.0 in | Wt 209.0 lb

## 2024-10-15 DIAGNOSIS — R030 Elevated blood-pressure reading, without diagnosis of hypertension: Secondary | ICD-10-CM

## 2024-10-15 DIAGNOSIS — R519 Headache, unspecified: Secondary | ICD-10-CM | POA: Diagnosis not present

## 2024-10-15 DIAGNOSIS — J0110 Acute frontal sinusitis, unspecified: Secondary | ICD-10-CM

## 2024-10-15 MED ORDER — BETAMETHASONE SOD PHOS & ACET 6 (3-3) MG/ML IJ SUSP
6.0000 mg | Freq: Once | INTRAMUSCULAR | Status: AC
Start: 2024-10-15 — End: 2024-10-15
  Administered 2024-10-15: 6 mg via INTRAMUSCULAR

## 2024-10-15 MED ORDER — AMOXICILLIN-POT CLAVULANATE 875-125 MG PO TABS: 1.0000 | ORAL_TABLET | Freq: Two times a day (BID) | ORAL | 0 refills | Status: DC

## 2024-10-15 MED ORDER — PSEUDOEPHEDRINE-GUAIFENESIN ER 60-600 MG PO TB12: 1.0000 | ORAL_TABLET | Freq: Two times a day (BID) | ORAL | 0 refills | Status: AC

## 2024-10-15 NOTE — Progress Notes (Signed)
 Subjective:  Patient ID: Natalie Ortiz, female    DOB: 1981/10/16  Age: 43 y.o. MRN: [id number]  CC: Headache (Off and on headache since Sunday. Behind eyes, in head, and sometimes in her ears. Wondering if it is sinuses or high BP? Throbbing headaches  and occasionally worse when she behinds over. Brought readings with her. //Treatment sinus otc Monday and Wednesday. )   HPI  Discussed the use of AI scribe software for clinical note transcription with the patient, who gave verbal consent to proceed.  History of Present Illness Natalie Ortiz is a 43 year old female who presents with a persistent headache.  She has been experiencing a persistent headache for four days, described as a moderate aching sensation across her forehead and occasionally at the back of her head. Initially, the headache was pounding but has since stabilized to a 5 out of 10 on the pain scale. No vision changes, photophobia, or phonophobia are present. She experiences occasional mild dizziness when standing quickly but no significant balance issues.  She has a history of sinus-related headaches, typically managed with sinus pills, which have only slightly alleviated her current symptoms. Her nose feels dry, and she reports that the heat has been on in her car and home, but she denies nasal congestion or significant drainage. Frequent throat clearing is attributed to long-standing allergies, and she denies having a cough.  She has been monitoring her blood pressure at home, noting some elevated readings, though she is not on treatment for hypertension. She uses a home blood pressure monitor infrequently and takes vitamin D  and Micronor , a low-dose contraceptive. She is not aware of any allergies to penicillin.          08/27/2024    1:27 PM 11/06/2023   11:53 AM 07/26/2023   11:15 AM  Depression screen PHQ 2/9  Decreased Interest 0 0   Down, Depressed, Hopeless 0 0   PHQ - 2 Score 0 0   Altered sleeping  0 0   Tired, decreased energy  0 0  Change in appetite  0 0  Feeling bad or failure about yourself   0 0  Trouble concentrating  0 0  Moving slowly or fidgety/restless  0 0  Suicidal thoughts  0 0  PHQ-9 Score  0   Difficult doing work/chores   Not difficult at all    History Natalie Ortiz has no past medical history on file.   She has no past surgical history on file.   Her family history includes Cirrhosis in her father; Diabetes in her mother; Hypertension in her mother.She reports that she has never smoked. She has never used smokeless tobacco. She reports that she does not currently use alcohol. She reports that she does not use drugs.    ROS Review of Systems  Constitutional: Negative.   HENT:  Positive for sinus pain. Negative for congestion.   Eyes:  Negative for visual disturbance.  Respiratory:  Negative for shortness of breath.   Cardiovascular:  Negative for chest pain.  Gastrointestinal:  Negative for abdominal pain, constipation, diarrhea, nausea and vomiting.  Genitourinary:  Negative for difficulty urinating.  Musculoskeletal:  Negative for arthralgias and myalgias.  Neurological:  Positive for headaches.  Psychiatric/Behavioral:  Negative for sleep disturbance.     Objective:  BP 128/81   Pulse 79   Temp 98.1 F (36.7 C)   Ht 5' 4 (1.626 m)   Wt 209 lb (94.8 kg)   SpO2 99%   BMI 35.87  kg/m   BP Readings from Last 3 Encounters:  10/15/24 128/81  08/27/24 (!) 130/91  11/06/23 122/87    Wt Readings from Last 3 Encounters:  10/15/24 209 lb (94.8 kg)  08/27/24 211 lb (95.7 kg)  11/06/23 212 lb (96.2 kg)     Physical Exam Constitutional:      General: She is not in acute distress.    Appearance: She is well-developed.  Cardiovascular:     Rate and Rhythm: Normal rate and regular rhythm.  Pulmonary:     Breath sounds: Normal breath sounds.  Musculoskeletal:        General: Normal range of motion.  Skin:    General: Skin is warm and dry.   Neurological:     Mental Status: She is alert and oriented to person, place, and time.     Sensory: No sensory deficit.     Motor: No weakness.     Coordination: Coordination normal.     Gait: Gait normal.    Physical Exam VITALS: BP- 123/85 GENERAL: Alert, cooperative, well developed, no acute distress. HEENT: Normocephalic, normal oropharynx, moist mucous membranes. Tenderness over right frontal sinus. Nasal passages swollen. CHEST: Clear to auscultation bilaterally. No wheezes, rhonchi, or crackles. CARDIOVASCULAR: Normal heart rate and rhythm. S1 and S2 normal without murmurs. ABDOMEN: Soft, non-tender, non-distended, without organomegaly. Normal bowel sounds. EXTREMITIES: No cyanosis or edema. NEUROLOGICAL: Cranial nerves grossly intact. Moves all extremities without gross motor or sensory deficit.   Assessment & Plan:  Acute intractable headache, unspecified headache type -     Betamethasone Sod Phos & Acet  Acute frontal sinusitis, recurrence not specified  Elevated blood-pressure reading without diagnosis of hypertension  Other orders -     Pseudoephedrine-guaiFENesin ER; Take 1 tablet by mouth every 12 (twelve) hours for 10 days. As needed for congestion  Dispense: 20 tablet; Refill: 0 -     Amoxicillin-Pot Clavulanate; Take 1 tablet by mouth 2 (two) times daily. Take all of this medication  Dispense: 20 tablet; Refill: 0    Assessment and Plan Assessment & Plan Acute sinusitis   She presents with acute sinusitis characterized by headache, nasal congestion, throat clearing, drainage, and tenderness over the frontal sinus. Nasal passages are swollen, and sinus pills have provided minimal relief. Administer a cortisone shot to alleviate the headache and prescribe Augmentin for the sinus infection. Recommend Mucinex D at a lower dose due to hypertension concerns and advise monitoring blood pressure as decongestants can elevate it.  Headache   She experiences a moderate  headache rated 5/10, persistent for four days, located across the forehead and back of the head, with occasional dizziness upon standing quickly. There are no visual disturbances, photophobia, or phonophobia. The headache is likely secondary to sinusitis. Administer a cortisone shot for headache relief and recommend Tylenol  for additional management if needed.  Elevated blood pressure   Her blood pressure readings are slightly elevated, ranging from 117/82 to 157/102, possibly secondary to sinusitis and headache. Discussed potential inaccuracies in home blood pressure monitor usage and storage. Advise monitoring blood pressure at home and discuss the potential need for long-term management with Dr. Maryanne if readings remain elevated after sinusitis treatment.  Contraceptive management   Discussed the potential interaction between Augmentin and Micronor , a low-dose contraceptive. Advise using condoms as an additional contraceptive measure during this cycle. Reassure that interaction is unlikely but recommend precautionary measures.       Follow-up: Return in about 1 month (around 11/15/2024), or if symptoms worsen  or fail to improve.  Butler Der, M.D.

## 2024-10-18 ENCOUNTER — Encounter: Payer: Self-pay | Admitting: Family Medicine

## 2024-11-09 ENCOUNTER — Ambulatory Visit: Admitting: Obstetrics & Gynecology

## 2024-12-07 ENCOUNTER — Ambulatory Visit: Admitting: Obstetrics & Gynecology

## 2024-12-07 ENCOUNTER — Other Ambulatory Visit (HOSPITAL_COMMUNITY)
Admission: RE | Admit: 2024-12-07 | Discharge: 2024-12-07 | Disposition: A | Discharge status: Home / Self Care | Source: Ambulatory Visit | Attending: Obstetrics & Gynecology | Admitting: Obstetrics & Gynecology

## 2024-12-07 ENCOUNTER — Encounter: Payer: Self-pay | Admitting: Obstetrics & Gynecology

## 2024-12-07 VITALS — BP 154/98 | HR 69 | Ht 64.0 in | Wt 214.0 lb

## 2024-12-07 DIAGNOSIS — R87810 Cervical high risk human papillomavirus (HPV) DNA test positive: Secondary | ICD-10-CM | POA: Diagnosis not present

## 2024-12-07 DIAGNOSIS — Z01419 Encounter for gynecological examination (general) (routine) without abnormal findings: Secondary | ICD-10-CM | POA: Diagnosis not present

## 2024-12-07 MED ORDER — NORETHINDRONE 0.35 MG PO TABS: 1.0000 | ORAL_TABLET | Freq: Every morning | ORAL | 4 refills | Status: AC

## 2024-12-07 NOTE — Progress Notes (Signed)
 Subjective:     Natalie Ortiz is a 43 y.o. female here for a routine exam.  No LMP recorded. (Menstrual status: Oral contraceptives). [id number] Birth Control Method:  Micronor  Menstrual Calendar(currently): amenorrhea on micronor   Current complaints: none.   Current acute medical issues:  none   Recent Gynecologic History No LMP recorded. (Menstrual status: Oral contraceptives). 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  Diagnosis - Negative for intraepithelial lesion or malignancy (NILM)  Comment Normal Reference Range HPV - Negative  Comment Normal Reference Range HPV 16- Negative  Comment Normal Reference Range HPV 16 18 45 -Negative  Resulting Agency [facility]        Specimen Collected: 11/06/23 12:25 Last Resulted: 11/11/23 16:56   Last mammogram: 07/2023,  normal  History reviewed. No pertinent past medical history.  History reviewed. No pertinent surgical history.  OB History     Gravida  2   Para  1   Term  1   Preterm  0   AB  1   Living  1      SAB      IAB      Ectopic      Multiple      Live Births              Social History   Socioeconomic History   Marital status: Married    Spouse name: Not on file   Number of children: 1   Years of education: Not on file   Highest education level: Not on file  Occupational History   Not on file  Tobacco Use   Smoking status: Never   Smokeless tobacco: Never  Vaping Use   Vaping status: Never Used  Substance and Sexual Activity   Alcohol use: Not Currently    Comment: rare   Drug use: No   Sexual activity: Yes    Birth control/protection: Pill  Other Topics Concern   Not on file  Social History Narrative   Not on file   Social Drivers of Health   Tobacco Use: Low Risk (12/07/2024)   Patient History     Smoking Tobacco Use: Never    Smokeless Tobacco Use: Never    Passive Exposure: Not on file  Financial Resource Strain: Low Risk (11/06/2023)   Overall Financial Resource Strain (CARDIA)    Difficulty of Paying Living Expenses: Not very hard  Food Insecurity: No Food Insecurity (06/18/2024)   Received from [facility]   Epic    Within the past 12 months, you worried that your food would run out before you got the money to buy more.: Never true    Within the past 12 months, the food you bought just didn't last and you didn't have money to get more.: Never true  Transportation Needs: No Transportation Needs (06/18/2024)   Received from [facility] - Transportation    Lack of Transportation (Medical): No    Lack of Transportation (Non-Medical): No  Physical Activity: Insufficiently Active (11/06/2023)   Exercise Vital Sign    Days of Exercise per Week: 2 days    Minutes of Exercise per Session: 20 min  Stress: No Stress Concern Present (11/06/2023)   [employer] - Occupational Stress Questionnaire  Feeling of Stress : Not at all  Social Connections: Moderately Integrated (11/06/2023)   Social Connection and Isolation Panel    Frequency of Communication with Friends and Family: Three times a week    Frequency of Social Gatherings with Friends and Family: Once a week    Attends Religious Services: 1 to 4 times per year    Active Member of Clubs or Organizations: No    Attends Banker Meetings: Never    Marital Status: Married  Depression (PHQ2-9): Low Risk (08/27/2024)   Depression (PHQ2-9)    PHQ-2 Score: 0  Alcohol Screen: Low Risk (11/06/2023)   Alcohol Screen    Last Alcohol Screening Score (AUDIT): 0  Housing: Low Risk (11/06/2023)   Housing    Last Housing Risk Score: 0  Utilities: Low Risk (06/18/2024)   Received from [facility]   Utilities    Within the past 12 months, have you been unable to get utilities(heat,  electricity) when it was really needed?: No  Health Literacy: Medium Risk (06/19/2024)   Received from [facility]   Health Literacy    : Rarely    Family History  Problem Relation Age of Onset   Cirrhosis Father    Diabetes Mother    Hypertension Mother     Current Medications[1]  Review of Systems  Review of Systems  Constitutional: Negative for fever, chills, weight loss, malaise/fatigue and diaphoresis.  HENT: Negative for hearing loss, ear pain, nosebleeds, congestion, sore throat, neck pain, tinnitus and ear discharge.   Eyes: Negative for blurred vision, double vision, photophobia, pain, discharge and redness.  Respiratory: Negative for cough, hemoptysis, sputum production, shortness of breath, wheezing and stridor.   Cardiovascular: Negative for chest pain, palpitations, orthopnea, claudication, leg swelling and PND.  Gastrointestinal: negative for abdominal pain. Negative for heartburn, nausea, vomiting, diarrhea, constipation, blood in stool and melena.  Genitourinary: Negative for dysuria, urgency, frequency, hematuria and flank pain.  Musculoskeletal: Negative for myalgias, back pain, joint pain and falls.  Skin: Negative for itching and rash.  Neurological: Negative for dizziness, tingling, tremors, sensory change, speech change, focal weakness, seizures, loss of consciousness, weakness and headaches.  Endo/Heme/Allergies: Negative for environmental allergies and polydipsia. Does not bruise/bleed easily.  Psychiatric/Behavioral: Negative for depression, suicidal ideas, hallucinations, memory loss and substance abuse. The patient is not nervous/anxious and does not have insomnia.        Objective:  Blood pressure (!) 150/90, pulse 69, height 5' 4 (1.626 m), weight 214 lb (97.1 kg).   Physical Exam  Vitals reviewed. Constitutional: She is oriented to person, place, and time. She appears well-developed and well-nourished.  HENT:  Head: Normocephalic and  atraumatic.        Right Ear: External ear normal.  Left Ear: External ear normal.  Nose: Nose normal.  Mouth/Throat: Oropharynx is clear and moist.  Eyes: Conjunctivae and EOM are normal. Pupils are equal, round, and reactive to light. Right eye exhibits no discharge. Left eye exhibits no discharge. No scleral icterus.  Neck: Normal range of motion. Neck supple. No tracheal deviation present. No thyromegaly present.  Cardiovascular: Normal rate, regular rhythm, normal heart sounds and intact distal pulses.  Exam reveals no gallop and no friction rub.   No murmur heard. Respiratory: Effort normal and breath sounds normal. No respiratory distress. She has no wheezes. She has no rales. She exhibits no tenderness.  GI: Soft. Bowel sounds are normal. She exhibits no distension and no mass. There is no  tenderness. There is no rebound and no guarding.  Genitourinary:  Breasts no masses skin changes or nipple changes bilaterally      Vulva is normal without lesions Vagina is pink moist without discharge, strong tense levator ani, recommend fatiguing it pre speculum in future Cervix normal in appearance and pap is done Uterus is normal size shape and contour Adnexa is negative with normal sized ovaries   Musculoskeletal: Normal range of motion. She exhibits no edema and no tenderness.  Neurological: She is alert and oriented to person, place, and time. She has normal reflexes. She displays normal reflexes. No cranial nerve deficit. She exhibits normal muscle tone. Coordination normal.  Skin: Skin is warm and dry. No rash noted. No erythema. No pallor.  Psychiatric: She has a normal mood and affect. Her behavior is normal. Judgment and thought content normal.       Medications Ordered at today's visit: Meds ordered this encounter  Medications   norethindrone  (MICRONOR ) 0.35 MG tablet    Sig: Take 1 tablet (0.35 mg total) by mouth every morning.    Dispense:  84 tablet    Refill:  4    Other  orders placed at today's visit: No orders of the defined types were placed in this encounter.    ASSESSMENT + PLAN:    ICD-10-CM   1. Well woman exam with routine gynecological exam  Z01.419     2. Encounter for gynecological examination with Papanicolaou smear of cervix  Z01.419 Cytology - PAP( [place])    3. 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     [1]  Current Outpatient Medications:    cholecalciferol (VITAMIN D3) 25 MCG (1000 UNIT) tablet, Take 1,000 Units by mouth daily., Disp: , Rfl:    norethindrone  (MICRONOR ) 0.35 MG tablet, Take 1 tablet (0.35 mg total) by mouth every morning., Disp: 84 tablet, Rfl: 4

## 2024-12-09 LAB — CYTOLOGY - PAP
Adequacy: ABSENT
Chlamydia: NEGATIVE
Comment: NEGATIVE
Comment: NEGATIVE
Comment: NORMAL
Diagnosis: NEGATIVE
High risk HPV: NEGATIVE
Neisseria Gonorrhea: NEGATIVE

## 2024-12-22 ENCOUNTER — Telehealth: Payer: Self-pay | Admitting: Obstetrics & Gynecology

## 2024-12-22 NOTE — Telephone Encounter (Signed)
 Patient calling about pap results. Please advise.

## 2025-08-30 ENCOUNTER — Encounter: Payer: Self-pay | Admitting: Family Medicine
